# Patient Record
Sex: Female | Born: 1970
Health system: Southern US, Community
[De-identification: ages and names within clinical notes are randomized; demographics above are authoritative.]

## PROBLEM LIST (undated history)

## (undated) DIAGNOSIS — K219 Gastro-esophageal reflux disease without esophagitis: Secondary | ICD-10-CM

## (undated) DIAGNOSIS — L309 Dermatitis, unspecified: Secondary | ICD-10-CM

## (undated) DIAGNOSIS — M1990 Unspecified osteoarthritis, unspecified site: Secondary | ICD-10-CM

## (undated) DIAGNOSIS — T170XXA Foreign body in nasal sinus, initial encounter: Secondary | ICD-10-CM

## (undated) HISTORY — DX: Unspecified osteoarthritis, unspecified site: M19.90

## (undated) HISTORY — DX: Foreign body in nasal sinus, initial encounter: T17.0XXA

## (undated) HISTORY — DX: Gastro-esophageal reflux disease without esophagitis: K21.9

## (undated) HISTORY — DX: Dermatitis, unspecified: L30.9

---

## 1999-10-01 ENCOUNTER — Ambulatory Visit (HOSPITAL_COMMUNITY): Admission: RE | Admit: 1999-10-01 | Discharge: 1999-10-01 | Payer: Self-pay | Admitting: *Deleted

## 1999-10-16 ENCOUNTER — Inpatient Hospital Stay (HOSPITAL_COMMUNITY): Admission: AD | Admit: 1999-10-16 | Discharge: 1999-10-18 | Payer: Self-pay | Admitting: *Deleted

## 1999-10-16 ENCOUNTER — Encounter (INDEPENDENT_AMBULATORY_CARE_PROVIDER_SITE_OTHER): Payer: Self-pay | Admitting: *Deleted

## 1999-10-20 ENCOUNTER — Encounter: Admission: RE | Admit: 1999-10-20 | Discharge: 2000-01-18 | Payer: Self-pay | Admitting: *Deleted

## 2000-09-24 ENCOUNTER — Ambulatory Visit (HOSPITAL_COMMUNITY): Admission: RE | Admit: 2000-09-24 | Discharge: 2000-09-24 | Payer: Self-pay | Admitting: *Deleted

## 2001-02-22 ENCOUNTER — Inpatient Hospital Stay (HOSPITAL_COMMUNITY): Admission: AD | Admit: 2001-02-22 | Discharge: 2001-02-24 | Payer: Self-pay | Admitting: Obstetrics

## 2002-02-08 ENCOUNTER — Encounter: Admission: RE | Admit: 2002-02-08 | Discharge: 2002-02-08 | Payer: Self-pay | Admitting: *Deleted

## 2002-02-25 ENCOUNTER — Encounter: Admission: RE | Admit: 2002-02-25 | Discharge: 2002-02-25 | Payer: Self-pay | Admitting: *Deleted

## 2002-05-02 ENCOUNTER — Encounter: Admission: RE | Admit: 2002-05-02 | Discharge: 2002-05-02 | Payer: Self-pay | Admitting: Internal Medicine

## 2002-05-25 ENCOUNTER — Encounter: Admission: RE | Admit: 2002-05-25 | Discharge: 2002-05-25 | Payer: Self-pay | Admitting: Internal Medicine

## 2003-08-02 ENCOUNTER — Inpatient Hospital Stay (HOSPITAL_COMMUNITY): Admission: AD | Admit: 2003-08-02 | Discharge: 2003-08-02 | Payer: Self-pay | Admitting: Obstetrics and Gynecology

## 2003-10-25 ENCOUNTER — Ambulatory Visit (HOSPITAL_COMMUNITY): Admission: RE | Admit: 2003-10-25 | Discharge: 2003-10-25 | Payer: Self-pay | Admitting: *Deleted

## 2004-01-26 ENCOUNTER — Inpatient Hospital Stay (HOSPITAL_COMMUNITY): Admission: AD | Admit: 2004-01-26 | Discharge: 2004-01-26 | Payer: Self-pay | Admitting: *Deleted

## 2004-03-12 ENCOUNTER — Inpatient Hospital Stay (HOSPITAL_COMMUNITY): Admission: AD | Admit: 2004-03-12 | Discharge: 2004-03-14 | Payer: Self-pay | Admitting: *Deleted

## 2004-05-28 ENCOUNTER — Encounter: Admission: RE | Admit: 2004-05-28 | Discharge: 2004-05-28 | Payer: Self-pay | Admitting: Obstetrics and Gynecology

## 2004-10-20 HISTORY — PX: OTHER SURGICAL HISTORY: SHX169

## 2006-08-10 ENCOUNTER — Emergency Department (HOSPITAL_COMMUNITY): Admission: EM | Admit: 2006-08-10 | Discharge: 2006-08-10 | Payer: Self-pay | Admitting: Emergency Medicine

## 2006-08-26 ENCOUNTER — Ambulatory Visit (HOSPITAL_COMMUNITY): Admission: RE | Admit: 2006-08-26 | Discharge: 2006-08-26 | Payer: Self-pay | Admitting: Obstetrics and Gynecology

## 2008-09-27 ENCOUNTER — Ambulatory Visit: Payer: Self-pay | Admitting: Obstetrics and Gynecology

## 2008-09-28 ENCOUNTER — Ambulatory Visit (HOSPITAL_COMMUNITY): Admission: RE | Admit: 2008-09-28 | Discharge: 2008-09-28 | Payer: Self-pay | Admitting: Obstetrics & Gynecology

## 2008-10-11 ENCOUNTER — Ambulatory Visit: Payer: Self-pay | Admitting: Obstetrics and Gynecology

## 2011-03-04 NOTE — Group Therapy Note (Signed)
NAMELESHAE, MCCLAY NO.:  [id number]   MEDICAL RECORD NO.:  [id number]          PATIENT TYPE:  WOC   LOCATION:  [facility]                   FACILITY:  [facility]   PHYSICIAN:  Argentina Donovan, MD        DATE OF BIRTH:  1971-06-06   DATE OF SERVICE:  09/27/2008                                  CLINIC NOTE   The patient is a 40 year old [place] female, gravida 3, para 3-0-0-3,  who had an IUD Mirena placed in 2005, [facility].  She  then went back to [place] and was seen by a physician there who on  examination found she had a clear cervical prolapse almost protruding  through the vagina at which time she was wearing a vaginal ring to  support the prolapse and had an intrauterine coil, which must be the  word they use there to describe intrauterine contraceptive devices as  that did not make much sense to me.  She was seen there in 2006, after  having had all her deliveries normally.  She resisted the idea of a  vaginal hysterectomy and repair, so he carried out shortening of the  vaginal portion of the cervix.  Under general anesthesia, shortening of  the vaginal portion of the cervix was done with refashioning of the  epithelium in support with the cardinal ligaments and the intrauterine  device was left in situ.  In 2009, she wanted the IUD removed.  On  examination, the tail could not be seen.  She was admitted to hospital  for removal of the IUD under general anesthesia and it could not be  removed, although it was seen on ultrasound.  The procedure was  abandoned for fear of complications.  She was discharged to home and  comes in today requesting the IUD removed.  I am not sure from his  description whether he placed another IUD or whether coil is the word  that they use for [place]UD [place].  In any case, I am going to  get an ultrasound to confirm the presence of the IUD and then when she  comes in again have a pelvic examination to see  whether or not it is  possible that the IUD can be removed or does she have cervical stenosis  to the point that it cannot.  She has not had any bleeding since a year  after the original IUD was placed which was 2006.   IMPRESSION:  If there is a trapped IUD, it is possible to remove this  IUD without doing a hysterectomy or some other major operation.  So I  think we need to find out as much as we can about that and hopefully the  ultrasound will give [place] some more information and then a plan can be  made or a discussion more intelligently with what can be expected as the  patient says after 5 years they told her to get the IUD out.           ______________________________  Argentina Donovan, MD     PR/MEDQ  D:  09/27/2008  T:  09/28/2008  Job:  [id number]

## 2011-03-07 NOTE — Group Therapy Note (Signed)
NAMENAIDELYN, PARRELLA NO.:  [id number]   MEDICAL RECORD NO.:  [id number]                   PATIENT TYPE:  OUT   LOCATION:  [facility]                           FACILITY:  [facility]   PHYSICIAN:  Argentina Donovan, MD                     DATE OF BIRTH:  1971-04-16   DATE OF SERVICE:  05/28/2004                                    CLINIC NOTE   REASON FOR VISIT:  The patient is a 40 year old [place] female gravida 3  para 3-0-0-3 who has been going to the health department and recently had a  Mirena IUD placed.  Her husband brought her in today because, first of all,  he thought the IUD was coming out; and secondly, the woman has a complete  uterine prolapse, a third degree.  We have examined the patient.  She has a  small uterus without a significant cystocele and only a small rectocele.  The string of the IUD was out 4 cm from the cervix and was cut short, even  with the cervix.  We discussed the different methods of possible surgery for  this lady who is not anxious to lose her uterus because of cultural reasons.  She had used a ring pessary prior to the birth of her other child and I  suggested her trying perhaps a balloon pessary which could be removed in  order to allow for sexual intercourse.  The husband and wife are going to  decide and think about the methods we discussed.  I have also suggested that  she might see Dr. Conley Simmonds in consultation and get some advice from her.   IMPRESSION:  Third degree uterine prolapse with minimal asymptomatic  cystocele and moderate rectocele.                                               Argentina Donovan, MD    PR/MEDQ  D:  05/28/2004  T:  05/29/2004  Job:  [id number]

## 2013-08-31 ENCOUNTER — Ambulatory Visit: Payer: No Typology Code available for payment source | Attending: Internal Medicine | Admitting: Internal Medicine

## 2013-08-31 ENCOUNTER — Encounter: Payer: Self-pay | Admitting: Internal Medicine

## 2013-08-31 VITALS — BP 109/77 | HR 83 | Temp 98.7°F | Resp 16 | Ht 65.0 in | Wt 164.0 lb

## 2013-08-31 DIAGNOSIS — R141 Gas pain: Secondary | ICD-10-CM | POA: Insufficient documentation

## 2013-08-31 DIAGNOSIS — N926 Irregular menstruation, unspecified: Secondary | ICD-10-CM

## 2013-08-31 DIAGNOSIS — R142 Eructation: Secondary | ICD-10-CM | POA: Insufficient documentation

## 2013-08-31 LAB — CBC
HCT: 39.3 % (ref 36.0–46.0)
Platelets: 365 10*3/uL (ref 150–400)
RBC: 4.52 MIL/uL (ref 3.87–5.11)
RDW: 13.7 % (ref 11.5–15.5)
WBC: 12.7 10*3/uL — ABNORMAL HIGH (ref 4.0–10.5)

## 2013-08-31 LAB — POCT URINALYSIS DIPSTICK
Glucose, UA: NEGATIVE
Ketones, UA: NEGATIVE
Leukocytes, UA: NEGATIVE
Protein, UA: NEGATIVE

## 2013-08-31 LAB — COMPLETE METABOLIC PANEL WITH GFR
ALT: 9 U/L (ref 0–35)
AST: 14 U/L (ref 0–37)
Chloride: 104 mEq/L (ref 96–112)
Creat: 0.55 mg/dL (ref 0.50–1.10)
Sodium: 138 mEq/L (ref 135–145)
Total Bilirubin: 0.3 mg/dL (ref 0.3–1.2)

## 2013-08-31 NOTE — Progress Notes (Signed)
Patient Demographics  Leah Cain, is a 42 y.o. female  ZOX:[id number]  [id number]  DOB - July 23, 1971  Chief Complaint  Patient presents with  . Establish Care  . Gynecologic Exam    need referral  . Bloated        Subjective:   Leah Cain today is here to establish primary care.  Patient is a 42 year old [place] female with no significant medical problems-she does have a history of vaginal prolapse-had a partial hysterectomy, also had a IUD in place that was not able to be removed in 2009, and is still in place. She has a note from her doctor in her back saying that even under general anesthesia the IUD could not be removed-this was in 2009. She presents to the clinic today seeking a GYN referral, and complaining of bloating-denies abdominal pain. Since 2009she has not had a menstrual period.  Any fever, vaginal discharge, abdominal pain, nausea, vomiting, diarrhea and dysuria.No Cough or SOB.   Currently patient has no complaints.   Objective:    Filed Vitals:   08/31/13 1607  BP: 109/77  Pulse: 83  Temp: 98.7 F (37.1 C)  TempSrc: Oral  Resp: 16  Height: 5\' 5"  (1.651 m)  Weight: 164 lb (74.39 kg)  SpO2: 100%     ALLERGIES:  No Known Allergies  PAST MEDICAL HISTORY: Past Medical History  Diagnosis Date  . Eczema     PAST SURGICAL HISTORY: Past Surgical History  Procedure Laterality Date  . Cervical prolapse  2006    FAMILY HISTORY: Family History  Problem Relation Age of Onset  . Stroke Father     MEDICATIONS AT HOME: Prior to Admission medications   Not on File    SOCIAL HISTORY:   reports that she has never smoked. She does not have any smokeless tobacco history on file. She reports that she does not drink alcohol or use illicit drugs.  REVIEW OF SYSTEMS:  Constitutional:   No   Fevers, chills, fatigue.  HEENT:    No headaches, Sore throat,   Cardio-vascular: No chest pain,  Orthopnea, swelling in lower extremities, anasarca,  palpitations  GI:  No abdominal pain, nausea, vomiting, diarrhea  Resp: No shortness of breath,  No coughing up of blood.No cough.No wheezing.  Skin:  no rash or lesions.  GU:  no dysuria, change in color of urine, no urgency or frequency.  No flank pain.  Musculoskeletal: No joint pain or swelling.  No decreased range of motion.  No back pain.  Psych: No change in mood or affect. No depression or anxiety.  No memory loss.   Exam  General appearance :Awake, alert, not in any distress. Speech Clear. Not toxic Looking HEENT: Atraumatic and Normocephalic, pupils equally reactive to light and accomodation Neck: supple, no JVD. No cervical lymphadenopathy.  Chest:Good air entry bilaterally, no added sounds  CVS: S1 S2 regular, no murmurs.  Abdomen: Bowel sounds present, Non tender and not distended with no gaurding, rigidity or rebound. Extremities: B/L Lower Ext shows no edema, both legs are warm to touch Neurology: Awake alert, and oriented X 3, CN II-XII intact, Non focal Skin:No Rash Wounds:N/A    Data Review   CBC No results found for this basename: WBC, HGB, HCT, PLT, MCV, MCH, MCHC, RDW, NEUTRABS, LYMPHSABS, MONOABS, EOSABS, BASOSABS, BANDABS, BANDSABD,  in the last 168 hours  Chemistries   No results found for this basename: NA, K, CL, CO2, GLUCOSE, BUN, CREATININE, GFRCGP, CALCIUM, MG, AST, ALT, ALKPHOS, BILITOT,  in the last 168 hours ------------------------------------------------------------------------------------------------------------------ No results found for this basename: HGBA1C,  in the last 72 hours ------------------------------------------------------------------------------------------------------------------ No results found for this basename: CHOL, HDL, LDLCALC, TRIG, CHOLHDL, LDLDIRECT,  in the last 72 hours ------------------------------------------------------------------------------------------------------------------ No results found for  this basename: TSH, T4TOTAL, FREET3, T3FREE, THYROIDAB,  in the last 72 hours ------------------------------------------------------------------------------------------------------------------ No results found for this basename: VITAMINB12, FOLATE, FERRITIN, TIBC, IRON, RETICCTPCT,  in the last 72 hours  Coagulation profile  No results found for this basename: INR, PROTIME,  in the last 168 hours    Assessment & Plan   General prolapse/retained IUD - Will seek urgent referral to GYN  In the meantime we'll check CBC, chemistries, TSH  Will also order a pelvic and transvaginal ultrasound   Follow up in 2-3 weeks  The patient was given clear instructions to go to ER or return to medical center if symptoms don't improve, worsen or new problems develop. The patient verbalized understanding. The patient was told to call to get lab results if they haven't heard anything in the next week.

## 2013-08-31 NOTE — Addendum Note (Signed)
Addended by: Nonnie Done D on: 08/31/2013 04:55 PM   Modules accepted: Orders

## 2013-08-31 NOTE — Progress Notes (Signed)
Pt here with family to interpreter Arabic language Establish care for hx bloating post cervical prolapse with surgical procedure States since surgery IUD lodged due to possible complication if removed. Pt has HPI  Denies vaginal bleeding or n/v/fever LMP- 2006 Need dental/gyn referral for IUD removal

## 2013-09-01 LAB — HEMOGLOBIN A1C
Hgb A1c MFr Bld: 5.1 % (ref <5.7)
Mean Plasma Glucose: 100 mg/dL (ref <117)

## 2013-09-02 ENCOUNTER — Ambulatory Visit (HOSPITAL_COMMUNITY)
Admission: RE | Admit: 2013-09-02 | Discharge: 2013-09-02 | Disposition: A | Discharge status: Home / Self Care | Payer: No Typology Code available for payment source | Source: Ambulatory Visit | Attending: Internal Medicine | Admitting: Internal Medicine

## 2013-09-02 DIAGNOSIS — N83209 Unspecified ovarian cyst, unspecified side: Secondary | ICD-10-CM | POA: Insufficient documentation

## 2013-09-02 DIAGNOSIS — Z975 Presence of (intrauterine) contraceptive device: Secondary | ICD-10-CM | POA: Insufficient documentation

## 2013-09-02 DIAGNOSIS — I862 Pelvic varices: Secondary | ICD-10-CM | POA: Insufficient documentation

## 2013-09-02 DIAGNOSIS — N926 Irregular menstruation, unspecified: Secondary | ICD-10-CM

## 2013-09-06 ENCOUNTER — Other Ambulatory Visit: Payer: Self-pay | Admitting: Internal Medicine

## 2013-09-06 DIAGNOSIS — Z1231 Encounter for screening mammogram for malignant neoplasm of breast: Secondary | ICD-10-CM

## 2013-09-07 ENCOUNTER — Telehealth: Payer: Self-pay | Admitting: Emergency Medicine

## 2013-09-07 NOTE — Telephone Encounter (Signed)
Message copied by Darlis Loan on Wed Sep 07, 2013  4:01 PM ------      Message from: Maretta Bees      Created: Tue Sep 06, 2013  2:14 PM       Patient needs referral to GYN Clinic for IUD retrieval-please make sure patient has a appointment, referral was placed last week. ------

## 2013-09-07 NOTE — Telephone Encounter (Signed)
Pt given referral information. Husband translated instructions

## 2013-09-19 ENCOUNTER — Encounter: Payer: Self-pay | Admitting: Internal Medicine

## 2013-09-19 ENCOUNTER — Ambulatory Visit: Payer: No Typology Code available for payment source | Attending: Internal Medicine | Admitting: Internal Medicine

## 2013-09-19 VITALS — BP 102/69 | HR 74 | Temp 98.3°F | Resp 16 | Ht 62.0 in | Wt 167.0 lb

## 2013-09-19 DIAGNOSIS — R1013 Epigastric pain: Secondary | ICD-10-CM | POA: Insufficient documentation

## 2013-09-19 DIAGNOSIS — K029 Dental caries, unspecified: Secondary | ICD-10-CM

## 2013-09-19 DIAGNOSIS — T8332XA Displacement of intrauterine contraceptive device, initial encounter: Secondary | ICD-10-CM | POA: Insufficient documentation

## 2013-09-19 DIAGNOSIS — T8339XA Other mechanical complication of intrauterine contraceptive device, initial encounter: Secondary | ICD-10-CM

## 2013-09-19 DIAGNOSIS — R1011 Right upper quadrant pain: Secondary | ICD-10-CM

## 2013-09-19 MED ORDER — TRAMADOL HCL 50 MG PO TABS: 50.0000 mg | ORAL_TABLET | Freq: Three times a day (TID) | ORAL | Status: DC | PRN

## 2013-09-19 MED ORDER — PANTOPRAZOLE SODIUM 40 MG PO TBEC: 40.0000 mg | DELAYED_RELEASE_TABLET | Freq: Every day | ORAL | Status: DC

## 2013-09-19 NOTE — Progress Notes (Signed)
Pt is needing a referral to a surgeon to remove her IUD.

## 2013-09-19 NOTE — Progress Notes (Signed)
Patient ID: Leah Cain, female   DOB: 03-05-1971, 42 y.o.   MRN: [id number] Patient Demographics  Leah Cain, is a 42 y.o. female  [id number]  [id number]  DOB - 11/10/1970  Chief Complaint  Patient presents with  . Follow-up        Subjective:   Leah Cain is a 42 y.o. female here today for a follow up visit. Patient claims that she had IUD insertion 9 years ago, she subsequently had a kind of surgery in [place] which was complicated by the IUD missing on chronic abdominal pain. Patient husband told me that the surgeon told him if he had to remove the IUD, that he would damage the uterus and that they better leave the IUD in situ. Patient also have pain in the upper part of the abdomen mostly on the right side radiating to the back usually after eating. Husband claimed that he will give her a tablet of oxycodone from his own prescription to relieve the pain. Pain is only after eating, no history of gallstone or PUD. No blood in stool. No nausea or vomiting, no diarrhea, no abdominal distention but she feels bloated after eating. Patient has No headache, No chest pain, No new weakness tingling or numbness, No Cough - SOB.  ALLERGIES: No Known Allergies  PAST MEDICAL HISTORY: Past Medical History  Diagnosis Date  . Eczema     MEDICATIONS AT HOME: Prior to Admission medications   Medication Sig Start Date End Date Taking? Authorizing Provider  pantoprazole (PROTONIX) 40 MG tablet Take 1 tablet (40 mg total) by mouth daily. 09/19/13   Jeanann Lewandowsky, MD  traMADol (ULTRAM) 50 MG tablet Take 1 tablet (50 mg total) by mouth every 8 (eight) hours as needed. 09/19/13   Jeanann Lewandowsky, MD     Objective:   Filed Vitals:   09/19/13 1633  BP: 102/69  Pulse: 74  Temp: 98.3 F (36.8 C)  TempSrc: Oral  Resp: 16  Height: 5\' 2"  (1.575 m)  Weight: 167 lb (75.751 kg)  SpO2: 98%    Exam General appearance : Awake, alert, not in any distress. Speech Clear. Not toxic  looking HEENT: Atraumatic and Normocephalic, pupils equally reactive to light and accomodation Neck: supple, no JVD. No cervical lymphadenopathy.  Chest:Good air entry bilaterally, no added sounds  CVS: S1 S2 regular, no murmurs.  Abdomen: Bowel sounds present, Non tender and not distended with no gaurding, rigidity or rebound. Extremities: B/L Lower Ext shows no edema, both legs are warm to touch Neurology: Awake alert, and oriented X 3, CN II-XII intact, Non focal Skin:No Rash Wounds:N/A   Data Review   CBC No results found for this basename: WBC, HGB, HCT, PLT, MCV, MCH, MCHC, RDW, NEUTRABS, LYMPHSABS, MONOABS, EOSABS, BASOSABS, BANDABS, BANDSABD,  in the last 168 hours  Chemistries   No results found for this basename: NA, K, CL, CO2, GLUCOSE, BUN, CREATININE, GFRCGP, CALCIUM, MG, AST, ALT, ALKPHOS, BILITOT,  in the last 168 hours ------------------------------------------------------------------------------------------------------------------ No results found for this basename: HGBA1C,  in the last 72 hours ------------------------------------------------------------------------------------------------------------------ No results found for this basename: CHOL, HDL, LDLCALC, TRIG, CHOLHDL, LDLDIRECT,  in the last 72 hours ------------------------------------------------------------------------------------------------------------------ No results found for this basename: TSH, T4TOTAL, FREET3, T3FREE, THYROIDAB,  in the last 72 hours ------------------------------------------------------------------------------------------------------------------ No results found for this basename: VITAMINB12, FOLATE, FERRITIN, TIBC, IRON, RETICCTPCT,  in the last 72 hours  Coagulation profile  No results found for this basename: INR, PROTIME,  in the last 168 hours  Assessment & Plan   1. IUD migration, initial encounter  - Ambulatory referral to Gynecology IUD removal  2. Postprandial  epigastric pain Will try PPI for possible GERD/PUD Ultrasound of the abdomen to rule out cholelithiasis  3. Postprandial RUQ pain  - traMADol (ULTRAM) 50 MG tablet; Take 1 tablet (50 mg total) by mouth every 8 (eight) hours as needed.  Dispense: 60 tablet; Refill: 0 - pantoprazole (PROTONIX) 40 MG tablet; Take 1 tablet (40 mg total) by mouth daily.  Dispense: 30 tablet; Refill: 3 - US Abdomen Complete  4. Dental caries  - Ambulatory referral to Dentistry    Follow up in    The patient was given clear instructions to go to ER or return to medical center if symptoms don't improve, worsen or new problems develop. The patient verbalized understanding. The patient was told to call to get lab results if they haven't heard anything in the next week.    Jeanann Lewandowsky, MD, MHA, FACP, FAAP [facility] [place], [place] [phone]   09/19/2013, 5:08 PM

## 2013-09-19 NOTE — Patient Instructions (Signed)
Abdominal Pain, Women °Abdominal (stomach, pelvic, or belly) pain can be caused by many things. It is important to tell your doctor: °· The location of the pain. °· Does it come and go or is it present all the time? °· Are there things that start the pain (eating certain foods, exercise)? °· Are there other symptoms associated with the pain (fever, nausea, vomiting, diarrhea)? °All of this is helpful to know when trying to find the cause of the pain. °CAUSES  °· Stomach: virus or bacteria infection, or ulcer. °· Intestine: appendicitis (inflamed appendix), regional ileitis (Crohn's disease), ulcerative colitis (inflamed colon), irritable bowel syndrome, diverticulitis (inflamed diverticulum of the colon), or cancer of the stomach or intestine. °· Gallbladder disease or stones in the gallbladder. °· Kidney disease, kidney stones, or infection. °· Pancreas infection or cancer. °· Fibromyalgia (pain disorder). °· Diseases of the female organs: °· Uterus: fibroid (non-cancerous) tumors or infection. °· Fallopian tubes: infection or tubal pregnancy. °· Ovary: cysts or tumors. °· Pelvic adhesions (scar tissue). °· Endometriosis (uterus lining tissue growing in the pelvis and on the pelvic organs). °· Pelvic congestion syndrome (female organs filling up with blood just before the menstrual period). °· Pain with the menstrual period. °· Pain with ovulation (producing an egg). °· Pain with an IUD (intrauterine device, birth control) in the uterus. °· Cancer of the female organs. °· Functional pain (pain not caused by a disease, may improve without treatment). °· Psychological pain. °· Depression. °DIAGNOSIS  °Your doctor will decide the seriousness of your pain by doing an examination. °· Blood tests. °· X-rays. °· Ultrasound. °· CT scan (computed tomography, special type of X-ray). °· MRI (magnetic resonance imaging). °· Cultures, for infection. °· Barium enema (dye inserted in the large intestine, to better view it with  X-rays). °· Colonoscopy (looking in intestine with a lighted tube). °· Laparoscopy (minor surgery, looking in abdomen with a lighted tube). °· Major abdominal exploratory surgery (looking in abdomen with a large incision). °TREATMENT  °The treatment will depend on the cause of the pain.  °· Many cases can be observed and treated at home. °· Over-the-counter medicines recommended by your caregiver. °· Prescription medicine. °· Antibiotics, for infection. °· Birth control pills, for painful periods or for ovulation pain. °· Hormone treatment, for endometriosis. °· Nerve blocking injections. °· Physical therapy. °· Antidepressants. °· Counseling with a psychologist or psychiatrist. °· Minor or major surgery. °HOME CARE INSTRUCTIONS  °· Do not take laxatives, unless directed by your caregiver. °· Take over-the-counter pain medicine only if ordered by your caregiver. Do not take aspirin because it can cause an upset stomach or bleeding. °· Try a clear liquid diet (broth or water) as ordered by your caregiver. Slowly move to a bland diet, as tolerated, if the pain is related to the stomach or intestine. °· Have a thermometer and take your temperature several times a day, and record it. °· Bed rest and sleep, if it helps the pain. °· Avoid sexual intercourse, if it causes pain. °· Avoid stressful situations. °· Keep your follow-up appointments and tests, as your caregiver orders. °· If the pain does not go away with medicine or surgery, you may try: °· Acupuncture. °· Relaxation exercises (yoga, meditation). °· Group therapy. °· Counseling. °SEEK MEDICAL CARE IF:  °· You notice certain foods cause stomach pain. °· Your home care treatment is not helping your pain. °· You need stronger pain medicine. °· You want your IUD removed. °· You feel faint or   lightheaded. °· You develop nausea and vomiting. °· You develop a rash. °· You are having side effects or an allergy to your medicine. °SEEK IMMEDIATE MEDICAL CARE IF:  °· Your  pain does not go away or gets worse. °· You have a fever. °· Your pain is felt only in portions of the abdomen. The right side could possibly be appendicitis. The left lower portion of the abdomen could be colitis or diverticulitis. °· You are passing blood in your stools (bright red or black tarry stools, with or without vomiting). °· You have blood in your urine. °· You develop chills, with or without a fever. °· You pass out. °MAKE SURE YOU:  °· Understand these instructions. °· Will watch your condition. °· Will get help right away if you are not doing well or get worse. °Document Released: 08/03/2007 Document Revised: 12/29/2011 Document Reviewed: 08/23/2009 °ExitCare® Patient Information ©2014 [employer]. ° °

## 2013-09-23 ENCOUNTER — Ambulatory Visit (HOSPITAL_COMMUNITY)
Admission: RE | Admit: 2013-09-23 | Discharge: 2013-09-23 | Disposition: A | Discharge status: Home / Self Care | Payer: No Typology Code available for payment source | Source: Ambulatory Visit | Attending: Internal Medicine | Admitting: Internal Medicine

## 2013-09-23 ENCOUNTER — Ambulatory Visit (HOSPITAL_COMMUNITY): Payer: No Typology Code available for payment source

## 2013-09-23 DIAGNOSIS — R1011 Right upper quadrant pain: Secondary | ICD-10-CM

## 2013-09-23 DIAGNOSIS — R109 Unspecified abdominal pain: Secondary | ICD-10-CM | POA: Insufficient documentation

## 2013-10-04 ENCOUNTER — Ambulatory Visit: Payer: No Typology Code available for payment source

## 2013-10-05 ENCOUNTER — Ambulatory Visit
Admission: RE | Admit: 2013-10-05 | Discharge: 2013-10-05 | Disposition: A | Discharge status: Home / Self Care | Payer: No Typology Code available for payment source | Source: Ambulatory Visit | Attending: Internal Medicine | Admitting: Internal Medicine

## 2013-10-05 DIAGNOSIS — Z1231 Encounter for screening mammogram for malignant neoplasm of breast: Secondary | ICD-10-CM

## 2013-10-10 ENCOUNTER — Telehealth: Payer: Self-pay | Admitting: *Deleted

## 2013-10-10 NOTE — Telephone Encounter (Signed)
I spoke to the husband who speaks better english and told him that her ultrasound came back normal.

## 2013-10-10 NOTE — Telephone Encounter (Signed)
Message copied by Raynelle Chary on Mon Oct 10, 2013 12:31 PM ------      Message from: Quentin Angst      Created: Fri Oct 07, 2013  3:46 PM       Please inform patient that her abdominal ultrasound is normal ------

## 2013-10-26 ENCOUNTER — Ambulatory Visit: Payer: No Typology Code available for payment source | Attending: Internal Medicine | Admitting: Internal Medicine

## 2013-10-26 ENCOUNTER — Ambulatory Visit: Payer: Medicaid Other

## 2013-10-26 ENCOUNTER — Encounter: Payer: Self-pay | Admitting: Internal Medicine

## 2013-10-26 VITALS — BP 123/83 | HR 67 | Temp 97.7°F | Resp 16 | Wt 169.0 lb

## 2013-10-26 DIAGNOSIS — M545 Low back pain, unspecified: Secondary | ICD-10-CM

## 2013-10-26 DIAGNOSIS — G8929 Other chronic pain: Secondary | ICD-10-CM

## 2013-10-26 DIAGNOSIS — R0981 Nasal congestion: Secondary | ICD-10-CM

## 2013-10-26 DIAGNOSIS — K029 Dental caries, unspecified: Secondary | ICD-10-CM

## 2013-10-26 DIAGNOSIS — R1011 Right upper quadrant pain: Secondary | ICD-10-CM

## 2013-10-26 DIAGNOSIS — J3489 Other specified disorders of nose and nasal sinuses: Secondary | ICD-10-CM

## 2013-10-26 DIAGNOSIS — M79675 Pain in left toe(s): Secondary | ICD-10-CM

## 2013-10-26 DIAGNOSIS — M79609 Pain in unspecified limb: Secondary | ICD-10-CM

## 2013-10-26 MED ORDER — FLUTICASONE PROPIONATE 50 MCG/ACT NA SUSP: 2.0000 | Freq: Every day | NASAL | Status: DC

## 2013-10-26 MED ORDER — TRAMADOL HCL 50 MG PO TABS: 50.0000 mg | ORAL_TABLET | Freq: Three times a day (TID) | ORAL | Status: DC | PRN

## 2013-10-26 NOTE — Progress Notes (Signed)
Patient here for  Medication refill Complains of chronic back pain Congestion and sneezing at night and in the am

## 2013-10-26 NOTE — Progress Notes (Signed)
MRN: [id number] Name: Leah Cain  Sex: female Age: 43 y.o. DOB: 1970-12-31  Allergies: Review of patient's allergies indicates no known allergies.  Chief Complaint  Patient presents with  . Back Pain    HPI: Patient is 43 y.o. female who comes today requesting refill on her pain medication has history of lower back pain, she also reported to have lot of nasal congestion dry mouth denies any sore throat fever chills chest pain or shortness of breath, she has lot of dental caries and is requesting to see a dentist, patient also reported to have left foot pain in the toes for long time which is getting worse.  Past Medical History  Diagnosis Date  . Eczema     Past Surgical History  Procedure Laterality Date  . Cervical prolapse  2006      Medication List       This list is accurate as of: 10/26/13  3:20 PM.  Always use your most recent med list.               fluticasone 50 MCG/ACT nasal spray  Commonly known as:  FLONASE  Place 2 sprays into both nostrils daily.     pantoprazole 40 MG tablet  Commonly known as:  PROTONIX  Take 1 tablet (40 mg total) by mouth daily.     traMADol 50 MG tablet  Commonly known as:  ULTRAM  Take 1 tablet (50 mg total) by mouth every 8 (eight) hours as needed.        Meds ordered this encounter  Medications  . fluticasone (FLONASE) 50 MCG/ACT nasal spray    Sig: Place 2 sprays into both nostrils daily.    Dispense:  16 g    Refill:  0  . traMADol (ULTRAM) 50 MG tablet    Sig: Take 1 tablet (50 mg total) by mouth every 8 (eight) hours as needed.    Dispense:  60 tablet    Refill:  0     There is no immunization history on file for this patient.  Family History  Problem Relation Age of Onset  . Stroke Father     History  Substance Use Topics  . Smoking status: Never Smoker   . Smokeless tobacco: Not on file  . Alcohol Use: No    Review of Systems  As noted in HPI  Filed Vitals:   10/26/13 1500  BP: 123/83   Pulse: 67  Temp: 97.7 F (36.5 C)  Resp: 16    Physical Exam  Physical Exam  HENT:  Nasal congestion no sinus tenderness   Eyes: EOM are normal. Pupils are equal, round, and reactive to light.  Cardiovascular: Normal rate and regular rhythm.   Pulmonary/Chest: Breath sounds normal. No respiratory distress. She has no wheezes. She has no rales.  Musculoskeletal:  Lower lumbar spinal and paraspinal tenderness, with SLR test patient complains of back discomfort , DTR 2+   Left foot toes with lateral deviation some tenderness to touch     CBC    Component Value Date/Time   WBC 12.7* 08/31/2013 1646   RBC 4.52 08/31/2013 1646   HGB 13.0 08/31/2013 1646   HCT 39.3 08/31/2013 1646   PLT 365 08/31/2013 1646   MCV 86.9 08/31/2013 1646    CMP     Component Value Date/Time   NA 138 08/31/2013 1655   K 3.6 08/31/2013 1655   CL 104 08/31/2013 1655   CO2 26 08/31/2013 1655   GLUCOSE 100*  08/31/2013 1655   BUN 13 08/31/2013 1655   CREATININE 0.55 08/31/2013 1655   CALCIUM 9.1 08/31/2013 1655   PROT 6.9 08/31/2013 1655   ALBUMIN 4.3 08/31/2013 1655   AST 14 08/31/2013 1655   ALT 9 08/31/2013 1655   ALKPHOS 73 08/31/2013 1655   BILITOT 0.3 08/31/2013 1655    No results found for this basename: chol, tri, ldl    No components found with this basename: hga1c    Lab Results  Component Value Date/Time   AST 14 08/31/2013  4:55 PM    Assessment and Plan  Chronic low back pain Patient was given refill on tramadol.  Nasal congestion - Plan: fluticasone (FLONASE) 50 MCG/ACT nasal spray  Pain in toe of left foot - Plan: Ambulatory referral to Podiatry  Dental cavities - Plan: Ambulatory referral to Dentistry  Followup her has a scheduled.  Lorayne Marek, MD

## 2013-11-02 ENCOUNTER — Encounter: Payer: Self-pay | Admitting: Podiatrist

## 2013-11-02 ENCOUNTER — Ambulatory Visit (INDEPENDENT_AMBULATORY_CARE_PROVIDER_SITE_OTHER): Payer: Medicaid Other

## 2013-11-02 ENCOUNTER — Ambulatory Visit (INDEPENDENT_AMBULATORY_CARE_PROVIDER_SITE_OTHER): Payer: Medicaid Other | Admitting: Podiatrist

## 2013-11-02 VITALS — BP 114/64 | HR 65 | Resp 18

## 2013-11-02 DIAGNOSIS — M2010 Hallux valgus (acquired), unspecified foot: Secondary | ICD-10-CM

## 2013-11-02 DIAGNOSIS — M79609 Pain in unspecified limb: Secondary | ICD-10-CM

## 2013-11-02 DIAGNOSIS — M216X9 Other acquired deformities of unspecified foot: Secondary | ICD-10-CM

## 2013-11-02 DIAGNOSIS — M2040 Other hammer toe(s) (acquired), unspecified foot: Secondary | ICD-10-CM

## 2013-11-02 DIAGNOSIS — R1011 Right upper quadrant pain: Secondary | ICD-10-CM

## 2013-11-02 MED ORDER — TRAMADOL HCL 50 MG PO TABS: 50.0000 mg | ORAL_TABLET | Freq: Three times a day (TID) | ORAL | Status: DC | PRN

## 2013-11-02 NOTE — Progress Notes (Signed)
   Subjective:    Patient ID: Leah Cain, female    DOB: [birth year], 43 y.o.   MRN: [id number]  HPI the pain is in my 2nd toe on the left and the pain is a 10 and no burning or throbbing and some numbness and tingling and hurts to wear shoes and the toes seems to be moving over each other and been going on for 7 years    Review of Systems  Constitutional: Negative.   HENT: Negative.   Eyes: Negative.   Respiratory: Negative.   Cardiovascular: Negative.   Gastrointestinal: Negative.   Endocrine: Negative.   Genitourinary: Negative.   Musculoskeletal: Negative.   Skin: Negative.   Allergic/Immunologic: Negative.   Neurological: Negative.   Hematological: Negative.   Psychiatric/Behavioral: Negative.        Objective:   Physical Exam Neurovascular status is intact with palpable pulses and neurological sensation intact bilaterally.  She has a significant bunion deformity present with a long 2nd toe.  The great toe is laterally deviated and is pressing against the 2nd toe.  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  Recommended a steroid injection which was declined.  Also discussed the risks and benefits or surgical correction.  Patient and her husband were given information regarding surgery and were instructed to consider their options.  If she elects to have the surgery done, she is to call for a pre-operative consult.

## 2013-11-02 NOTE — Patient Instructions (Addendum)
Bunionectomy A bunionectomy is surgery to remove a bunion. A bunion is an enlargement of the joint at the base of the big toe. It is made up of bone and soft tissue on the inside part of the joint. Over time, a painful lump appears on the inside of the joint. The big toe begins to point inward toward the second toe. New bone growth can occur and a bone spur may form. The pain eventually causes difficulty walking. A bunion usually results from inflammation caused by the irritation of poorly fitting shoes. It often begins later in life. A bunionectomy is performed when nonsurgical treatment no longer works. When surgery is needed, the extent of the procedure will depend on the degree of deformity of the foot. Your surgeon will discuss with you the different procedures and what will work best for you depending on your age and health. LET YOUR CAREGIVER KNOW ABOUT:   Previous problems with anesthetics or medicines used to numb the skin.  Allergies to dyes, iodine, foods, and/or latex.  Medicines taken including herbs, eye drops, prescription medicines (especially medicines used to "thin the blood"), aspirin and other over-the-counter medicines, and steroids (by mouth or as a cream).  History of bleeding or blood problems.  Possibility of pregnancy, if this applies.  History of blood clots in your legs and/or lungs .  Previous surgery.  Other important health problems. RISKS AND COMPLICATIONS   Infection.  Pain.  Nerve damage.  Possibility that the bunion will recur. BEFORE THE PROCEDURE  You should be present 60 minutes prior to your procedure or as directed.  PROCEDURE  Surgery is often done so that you can go home the same day (outpatient). It may be done in a hospital or in an outpatient surgical center. An anesthetic will be used to help you sleep during the procedure. Sometimes, a spinal anesthetic is used to make you numb below the waist. A cut (incision) is made over the swollen  area at the first joint of the big toe. The enlarged lump will be removed. If there is a need to reposition the bones of the big toe, this may require more than 1 incision. The bone itself may need to be cut. Screws and wires may be used in the repair. These can be removed at a later date. In severe cases, the entire joint may need to be removed and a joint replacement inserted. When done, the incision is closed with stitches (sutures). Skin adhesive strips may be added for reinforcement. They help hold the incision closed.  AFTER THE PROCEDURE  Compression bandages (dressings) are then wrapped around the wound. This helps to keep the foot in alignment and reduce swelling. Your foot will be monitored for bleeding and swelling. You will need to stay for a few hours in the recovery area before being discharged. This allows time for the anesthesia to wear off. You will be discharged home when you are awake, stable, and doing well. HOME CARE INSTRUCTIONS   You can expect to return to normal activities within 6 to 8 weeks after surgery. The foot is at increased risk for swelling for several months. When you can expect to bear weight on the operated foot will depend on the extent of your surgery. The milder the deformity, the less tissue is removed and the sooner the return to normal activity level. During the recovery period, a special shoe, boot, or cast may be worn to accommodate the surgical bandage and to help provide stability   to the foot.  Once you are home, an ice pack applied to the operative site may help with discomfort and keep swelling down. Stop using the ice if it causes discomfort.  Keep your feet raised (elevated) when possible to lessen swelling.  If you have an elastic bandage on your foot and you have numbness, tingling, or your foot becomes cold and blue, adjust the bandage to make it comfortable.  Change dressings as directed.  Keep the wound dry and clean. The wound may be washed  gently with soap and water. Gently blot dry without rubbing. Do not take baths or use swimming pools or hot tubs for 10 days, or as instructed by your caregiver.  Only take over-the-counter or prescription medicines for pain, discomfort, or fever as directed by your caregiver.  You may continue a normal diet as directed.  For activity, use crutches with no weight bearing or your orthopedic shoe as directed. Continue to use crutches or a cane as directed until you can stand without causing pain. SEEK MEDICAL CARE IF:   You have redness, swelling, bruising, or increasing pain in the wound.  There is pus coming from the wound.  You have drainage from a wound lasting longer than 1 day.  You have an oral temperature above 102 F (38.9 C).  You notice a bad smell coming from the wound or dressing.  The wound breaks open after sutures have been removed.  You develop dizzy episodes or fainting while standing.  You have persistent nausea or vomiting.  Your toes become cold.  Pain is not relieved with medicines. SEEK IMMEDIATE MEDICAL CARE IF:   You develop a rash.  You have difficulty breathing.  You develop any reaction or side effects to medicines given.  Your toes are numb or blue, or you have severe pain. MAKE SURE YOU:   Understand these instructions.  Will watch your condition.  Will get help right away if you are not doing well or get worse. Document Released: 09/19/2005 Document Revised: 12/29/2011 Document Reviewed: 10/25/2007 ExitCare Patient Information 2014 [employer].  

## 2013-11-07 ENCOUNTER — Ambulatory Visit (INDEPENDENT_AMBULATORY_CARE_PROVIDER_SITE_OTHER): Payer: Medicaid Other | Admitting: Family Medicine

## 2013-11-07 ENCOUNTER — Encounter: Payer: Self-pay | Admitting: Family Medicine

## 2013-11-07 VITALS — BP 115/71 | HR 76 | Temp 97.2°F | Ht 62.0 in | Wt 160.4 lb

## 2013-11-07 DIAGNOSIS — R1011 Right upper quadrant pain: Secondary | ICD-10-CM

## 2013-11-07 MED ORDER — PANTOPRAZOLE SODIUM 40 MG PO TBEC: 40.0000 mg | DELAYED_RELEASE_TABLET | Freq: Two times a day (BID) | ORAL | Status: DC

## 2013-11-07 MED ORDER — TRAMADOL HCL 50 MG PO TABS: 50.0000 mg | ORAL_TABLET | Freq: Three times a day (TID) | ORAL | Status: DC | PRN

## 2013-11-07 NOTE — Patient Instructions (Signed)
Bloating  Bloating is the feeling of fullness in your belly. You may feel as though your pants are too tight. Often the cause of bloating is overeating, retaining fluids, or having gas in your bowel. It is also caused by swallowing air and eating foods that cause gas. Irritable bowel syndrome is one of the most common causes of bloating. Constipation is also a common cause. Sometimes more serious problems can cause bloating.  SYMPTOMS   Usually there is a feeling of fullness, as though your abdomen is bulged out. There may be mild discomfort.   DIAGNOSIS   Usually no particular testing is necessary for most bloating. If the condition persists and seems to become worse, your caregiver may do additional testing.   TREATMENT   · There is no direct treatment for bloating.  · Do not put gas into the bowel. Avoid chewing gum and sucking on candy. These tend to make you swallow air. Swallowing air can also be a nervous habit. Try to avoid this.  · Avoiding high residue diets will help. Eat foods with soluble fibers (examples include root vegetables, apples, or barley) and substitute dairy products with soy and rice products. This helps irritable bowel syndrome.  · If constipation is the cause, then a high residue diet with more fiber will help.  · Avoid carbonated beverages.  · Over-the-counter preparations are available that help reduce gas. Your pharmacist can help you with this.  SEEK MEDICAL CARE IF:   · Bloating continues and seems to be getting worse.  · You notice a weight gain.  · You have a weight loss but the bloating is getting worse.  · You have changes in your bowel habits or develop nausea or vomiting.  SEEK IMMEDIATE MEDICAL CARE IF:   · You develop shortness of breath or swelling in your legs.  · You have an increase in abdominal pain or develop chest pain.  Document Released: 08/06/2006 Document Revised: 12/29/2011 Document Reviewed: 09/24/2007  ExitCare® Patient Information ©2014 [employer].

## 2013-11-07 NOTE — Progress Notes (Signed)
Pt having issues with abdominal pain related to eating. Pt reports post prandial she will have bloating and abdominal pain almost every meal. Pt has IUD for ~10 years and is concerned it may be related and would like it removed. Reports attempted removal and unable to do in [place]. Pt reports having surgery and after surgery unable to remove the IUD. IUD placed ~2005, surgery 2006  O: Filed Vitals:   11/07/13 1408  BP: 115/71  Pulse: 76  Temp: 97.2 F (36.2 C)   VSS, NAD SSE: pt with what appears to be a cuff, no cervix or string visualized.  Abdomin no severe tenderness but diffusely uncomfortable. No cce  [place]: IUD intrauterine  A/p: Pain and bloating unlikely from mirena. While pt does have menstraul cramping monthly they are mild and unlikely the etiology of discomfort. Will start patient on protonix 40mg  BID and collect a stool sample for evaluation of H pylori. If present will treat H. Pylori.   F/u 4wks for further evaluation or follow up with primary doctor that is also initiating a work up.  Discussed with Dr. Ihor Dow and recommend against surgical IUD removal if not causing pain. No indication for hysterectomy as long as patient is comfortable. No acute issues at this time.  Fredrik Rigger, MD OB Fellow

## 2013-11-11 NOTE — Addendum Note (Signed)
Addended by: Ernie Avena on: 11/11/2013 09:18 AM   Modules accepted: Orders

## 2013-11-15 ENCOUNTER — Ambulatory Visit: Payer: Medicaid Other | Admitting: Internal Medicine

## 2013-11-15 LAB — HELICOBACTER PYLORI  SPECIAL ANTIGEN: H. PYLORI ANTIGEN STOOL: POSITIVE

## 2013-11-23 ENCOUNTER — Ambulatory Visit: Payer: Medicaid Other | Admitting: Internal Medicine

## 2013-12-05 ENCOUNTER — Ambulatory Visit: Payer: Medicaid Other | Attending: Internal Medicine

## 2013-12-12 ENCOUNTER — Ambulatory Visit: Payer: Medicaid Other | Admitting: Obstetrics & Gynecology

## 2013-12-26 ENCOUNTER — Encounter: Payer: Self-pay | Admitting: Internal Medicine

## 2013-12-26 ENCOUNTER — Ambulatory Visit: Payer: Medicaid Other | Attending: Internal Medicine | Admitting: Internal Medicine

## 2013-12-26 VITALS — BP 116/81 | HR 72 | Temp 98.0°F | Resp 16 | Wt 169.8 lb

## 2013-12-26 DIAGNOSIS — Z09 Encounter for follow-up examination after completed treatment for conditions other than malignant neoplasm: Secondary | ICD-10-CM

## 2013-12-26 DIAGNOSIS — R1013 Epigastric pain: Secondary | ICD-10-CM

## 2013-12-26 DIAGNOSIS — A048 Other specified bacterial intestinal infections: Secondary | ICD-10-CM | POA: Insufficient documentation

## 2013-12-26 DIAGNOSIS — R1011 Right upper quadrant pain: Secondary | ICD-10-CM | POA: Insufficient documentation

## 2013-12-26 DIAGNOSIS — K3189 Other diseases of stomach and duodenum: Secondary | ICD-10-CM | POA: Insufficient documentation

## 2013-12-26 MED ORDER — TRAMADOL HCL 50 MG PO TABS: 50.0000 mg | ORAL_TABLET | Freq: Three times a day (TID) | ORAL | Status: DC | PRN

## 2013-12-26 MED ORDER — AMOXICILLIN 500 MG PO CAPS
1000.0000 mg | ORAL_CAPSULE | Freq: Two times a day (BID) | ORAL | Status: DC
Start: 2013-12-26 — End: 2014-07-30

## 2013-12-26 MED ORDER — CLARITHROMYCIN 500 MG PO TABS: 500.0000 mg | ORAL_TABLET | Freq: Two times a day (BID) | ORAL | Status: DC

## 2013-12-26 NOTE — Progress Notes (Signed)
Patient here for follow up Has an IUD implanted that can not be taken out Causes her to have pain in her stomach and back

## 2013-12-26 NOTE — Progress Notes (Signed)
MRN: [id number] Name: Leah Cain  Sex: female Age: 43 y.o. DOB: 1971-09-12  Allergies: Review of patient's allergies indicates no known allergies.  Chief Complaint  Patient presents with  . Follow-up    HPI: Patient is 43 y.o. female who comes today for followup, she is history of IUD in uterus which has migrated, already following her with the gynecologist at this point we have an option to go for surgery, family is still deciding, she also has lot of postprandial epigastric pain and bloating symptoms, she was started on Protonix recently as well as had a stool antigen test done for H. pylori which I reviewed has come back positive, she has not noticed any improvement with being on Protonix, she is also requesting refill on tramadol which helps her with her low back pain, denies any nausea vomiting.   Past Medical History  Diagnosis Date  . Eczema     Past Surgical History  Procedure Laterality Date  . Cervical prolapse  2006      Medication List       This list is accurate as of: 12/26/13  4:41 PM.  Always use your most recent med list.               amoxicillin 500 MG capsule  Commonly known as:  AMOXIL  Take 2 capsules (1,000 mg total) by mouth 2 (two) times daily.     clarithromycin 500 MG tablet  Commonly known as:  BIAXIN  Take 1 tablet (500 mg total) by mouth 2 (two) times daily.     fluticasone 50 MCG/ACT nasal spray  Commonly known as:  FLONASE  Place 2 sprays into both nostrils daily.     pantoprazole 40 MG tablet  Commonly known as:  PROTONIX  Take 1 tablet (40 mg total) by mouth 2 (two) times daily.     traMADol 50 MG tablet  Commonly known as:  ULTRAM  Take 1 tablet (50 mg total) by mouth every 8 (eight) hours as needed.        Meds ordered this encounter  Medications  . traMADol (ULTRAM) 50 MG tablet    Sig: Take 1 tablet (50 mg total) by mouth every 8 (eight) hours as needed.    Dispense:  40 tablet    Refill:  0  . clarithromycin  (BIAXIN) 500 MG tablet    Sig: Take 1 tablet (500 mg total) by mouth 2 (two) times daily.    Dispense:  28 tablet    Refill:  0  . amoxicillin (AMOXIL) 500 MG capsule    Sig: Take 2 capsules (1,000 mg total) by mouth 2 (two) times daily.    Dispense:  56 capsule    Refill:  0     There is no immunization history on file for this patient.  Family History  Problem Relation Age of Onset  . Stroke Father     History  Substance Use Topics  . Smoking status: Never Smoker   . Smokeless tobacco: Not on file  . Alcohol Use: No    Review of Systems   As noted in HPI  Filed Vitals:   12/26/13 1609  BP: 116/81  Pulse: 72  Temp: 98 F (36.7 C)  Resp: 16    Physical Exam  Physical Exam  Constitutional: No distress.  Eyes: EOM are normal. Pupils are equal, round, and reactive to light.  Cardiovascular: Normal rate and regular rhythm.   Pulmonary/Chest: Breath sounds normal. No respiratory  distress. She has no wheezes. She has no rales.  Abdominal: Bowel sounds are normal.  Minimal periumbilical tenderness no rebound or guarding    CBC    Component Value Date/Time   WBC 12.7* 08/31/2013 1646   RBC 4.52 08/31/2013 1646   HGB 13.0 08/31/2013 1646   HCT 39.3 08/31/2013 1646   PLT 365 08/31/2013 1646   MCV 86.9 08/31/2013 1646    CMP     Component Value Date/Time   NA 138 08/31/2013 1655   K 3.6 08/31/2013 1655   CL 104 08/31/2013 1655   CO2 26 08/31/2013 1655   GLUCOSE 100* 08/31/2013 1655   BUN 13 08/31/2013 1655   CREATININE 0.55 08/31/2013 1655   CALCIUM 9.1 08/31/2013 1655   PROT 6.9 08/31/2013 1655   ALBUMIN 4.3 08/31/2013 1655   AST 14 08/31/2013 1655   ALT 9 08/31/2013 1655   ALKPHOS 73 08/31/2013 1655   BILITOT 0.3 08/31/2013 1655    No results found for this basename: chol, tri, ldl    No components found with this basename: hga1c    Lab Results  Component Value Date/Time   AST 14 08/31/2013  4:55 PM    Assessment and Plan  Follow  up  Dyspepsia  Positive H. pylori test - Plan: She will continue with Protonix, I have started patient on 2 weeks of clarithromycin and Amoxil clarithromycin (BIAXIN) 500 MG tablet, amoxicillin (AMOXIL) 500 MG capsule  Postprandial RUQ pain - Plan: traMADol (ULTRAM) 50 MG tablet  If she is symptomatically not improved then we'll consider referral to GI for endoscopy.  Return in about 1 month (around 01/26/2014).  Lorayne Marek, MD

## 2014-01-30 ENCOUNTER — Ambulatory Visit: Payer: Self-pay | Attending: Internal Medicine | Admitting: Internal Medicine

## 2014-01-30 VITALS — BP 109/73 | HR 76 | Temp 97.0°F | Resp 17

## 2014-01-30 DIAGNOSIS — M545 Low back pain, unspecified: Secondary | ICD-10-CM | POA: Insufficient documentation

## 2014-01-30 DIAGNOSIS — M533 Sacrococcygeal disorders, not elsewhere classified: Secondary | ICD-10-CM

## 2014-01-30 DIAGNOSIS — R1011 Right upper quadrant pain: Secondary | ICD-10-CM | POA: Insufficient documentation

## 2014-01-30 DIAGNOSIS — R0981 Nasal congestion: Secondary | ICD-10-CM

## 2014-01-30 DIAGNOSIS — J3489 Other specified disorders of nose and nasal sinuses: Secondary | ICD-10-CM | POA: Insufficient documentation

## 2014-01-30 LAB — BASIC METABOLIC PANEL
BUN: 15 mg/dL (ref 6–23)
CALCIUM: 9.5 mg/dL (ref 8.4–10.5)
CHLORIDE: 104 meq/L (ref 96–112)
CO2: 26 mEq/L (ref 19–32)
CREATININE: 0.51 mg/dL (ref 0.50–1.10)
Glucose, Bld: 93 mg/dL (ref 70–99)
Potassium: 4.8 mEq/L (ref 3.5–5.3)
Sodium: 137 mEq/L (ref 135–145)

## 2014-01-30 MED ORDER — FLUTICASONE PROPIONATE 50 MCG/ACT NA SUSP: 2.0000 | Freq: Every day | NASAL | Status: DC

## 2014-01-30 MED ORDER — TRIAMCINOLONE ACETONIDE 0.025 % EX OINT: 1.0000 "application " | TOPICAL_OINTMENT | Freq: Two times a day (BID) | CUTANEOUS | Status: DC

## 2014-01-30 MED ORDER — TRAMADOL HCL 50 MG PO TABS: 50.0000 mg | ORAL_TABLET | Freq: Two times a day (BID) | ORAL | Status: DC | PRN

## 2014-01-30 MED ORDER — CYCLOBENZAPRINE HCL 10 MG PO TABS: 10.0000 mg | ORAL_TABLET | Freq: Every day | ORAL | Status: DC

## 2014-01-30 MED ORDER — CETIRIZINE HCL 10 MG PO TABS: 10.0000 mg | ORAL_TABLET | Freq: Every day | ORAL | Status: DC

## 2014-01-30 NOTE — Progress Notes (Signed)
Patient here for follow up Here with interpreter Complains of cough  Bilateral hands are dry

## 2014-01-30 NOTE — Progress Notes (Signed)
Patient ID: Leah Cain, female   DOB: Mar 26, 1971, 43 y.o.   MRN: [id number]   CC:  HPI:  43 year old female here for evaluation of her back pain and abdominal pain. Patient recently treated for H. pylori and given a two-week course of amoxicillin and clarithromycin. She reports that her symptoms are significantly better her bowel movements are more regular and she has less abdominal discomfort and bloating. She also has a history of IUD that was placed 10 years ago, but has been unable to be removed. The patient has already been seen by,Leah R Odom, MD, she was recommended not to have surgical IUD removal, neither a hysterectomy.  Patient complains of lumbosacral pain today, with radiation of the pain down into the right leg all the way to her medial first and second digit of the right foot. The patient denies any stool or urinary incontinence.  She also complains of sinus drainage, was on Flonase previously that seems to help, has not used it for a while. Also complains of eczema on both hands, for which she is using betamethasone cream.   No Known Allergies Past Medical History  Diagnosis Date  . Eczema    Current Outpatient Prescriptions on File Prior to Visit  Medication Sig Dispense Refill  . amoxicillin (AMOXIL) 500 MG capsule Take 2 capsules (1,000 mg total) by mouth 2 (two) times daily.  56 capsule  0  . clarithromycin (BIAXIN) 500 MG tablet Take 1 tablet (500 mg total) by mouth 2 (two) times daily.  28 tablet  0  . pantoprazole (PROTONIX) 40 MG tablet Take 1 tablet (40 mg total) by mouth 2 (two) times daily.  90 tablet  3   No current facility-administered medications on file prior to visit.   Family History  Problem Relation Age of Onset  . Stroke Leah Cain    History   Social History  . Marital Status: Married    Spouse Name: N/A    Number of Children: N/A  . Years of Education: N/A   Occupational History  . Not on file.   Social History Main Topics  . Smoking  status: Never Smoker   . Smokeless tobacco: Not on file  . Alcohol Use: No  . Drug Use: No  . Sexual Activity: Not on file   Other Topics Concern  . Not on file   Social History Narrative  . No narrative on file    Review of Systems  Constitutional: Negative for fever, chills, diaphoresis, activity change, appetite change and fatigue.  HENT: Negative for ear pain, nosebleeds, congestion, facial swelling, rhinorrhea, neck pain, neck stiffness and ear discharge.   Eyes: Negative for pain, discharge, redness, itching and visual disturbance.  Respiratory: Negative for cough, choking, chest tightness, shortness of breath, wheezing and stridor.   Cardiovascular: Negative for chest pain, palpitations and leg swelling.  Gastrointestinal: Negative for abdominal distention.  Genitourinary: Negative for dysuria, urgency, frequency, hematuria, flank pain, decreased urine volume, difficulty urinating and dyspareunia.  Musculoskeletal: Negative for back pain, joint swelling, arthralgias and gait problem.  Neurological: Negative for dizziness, tremors, seizures, syncope, facial asymmetry, speech difficulty, weakness, light-headedness, numbness and headaches.  Hematological: Negative for adenopathy. Does not bruise/bleed easily.  Psychiatric/Behavioral: Negative for hallucinations, behavioral problems, confusion, dysphoric mood, decreased concentration and agitation.    Objective:   Filed Vitals:   01/30/14 1115  BP: 109/73  Pulse: 76  Temp: 97 F (36.1 C)  Resp: 17    Physical Exam  Constitutional: Appears well-developed and  well-nourished. No distress.  HENT: Normocephalic. External right and left ear normal. Oropharynx is clear and moist.  Eyes: Conjunctivae and EOM are normal. PERRLA, no scleral icterus.  Neck: Normal ROM. Neck supple. No JVD. No tracheal deviation. No thyromegaly.  CVS: RRR, S1/S2 +, no murmurs, no gallops, no carotid bruit.  Pulmonary: Effort and breath sounds  normal, no stridor, rhonchi, wheezes, rales.  Abdominal: Soft. BS +,  no distension, tenderness, rebound or guarding.  Musculoskeletal: Normal range of motion. No edema and no tenderness.  Lymphadenopathy: No lymphadenopathy noted, cervical, inguinal. Neuro: Alert. Normal reflexes, muscle tone coordination. No cranial nerve deficit. Skin: Skin is warm and dry. No rash noted. Not diaphoretic. No erythema. No pallor.  Psychiatric: Normal mood and affect. Behavior, judgment, thought content normal.   Lab Results  Component Value Date   WBC 12.7* 08/31/2013   HGB 13.0 08/31/2013   HCT 39.3 08/31/2013   MCV 86.9 08/31/2013   PLT 365 08/31/2013   Lab Results  Component Value Date   CREATININE 0.55 08/31/2013   BUN 13 08/31/2013   NA 138 08/31/2013   K 3.6 08/31/2013   CL 104 08/31/2013   CO2 26 08/31/2013    Lab Results  Component Value Date   HGBA1C 5.1 08/31/2013   Lipid Panel  No results found for this basename: chol, trig, hdl, cholhdl, vldl, ldlcalc       Assessment and plan:   Patient Active Problem List   Diagnosis Date Noted  . Positive H. pylori test 12/26/2013  . IUD migration 09/19/2013  . Postprandial epigastric pain 09/19/2013  . Postprandial RUQ pain 09/19/2013   Eczematous dermatitis of both hands Patient advised to continue betamethasone cream which she brought from [place] Prescription for triamcinolone provided    Back pain Complaining of lumbar radiculopathy/sciatica Refill tramadol Started the patient on Flexeril at bedtime, currently using her husbands Flexeril CT lumbar, abdomen and pelvis Avoid MRI because of IUD   Allergic rhinitis Continue Flonase Started the patient on zyrtec   The patient was given clear instructions to go to ER or return to medical center if symptoms don't improve, worsen or new problems develop. The patient verbalized understanding. The patient was told to call to get any lab results if not heard anything in the  next week.

## 2014-02-03 ENCOUNTER — Ambulatory Visit (HOSPITAL_COMMUNITY): Admission: RE | Admit: 2014-02-03 | Payer: Self-pay | Source: Ambulatory Visit

## 2014-03-02 ENCOUNTER — Telehealth: Payer: Self-pay

## 2014-03-02 ENCOUNTER — Other Ambulatory Visit: Payer: Self-pay

## 2014-03-02 ENCOUNTER — Telehealth: Payer: Self-pay | Admitting: Internal Medicine

## 2014-03-02 DIAGNOSIS — R1011 Right upper quadrant pain: Secondary | ICD-10-CM

## 2014-03-02 MED ORDER — TRAMADOL HCL 50 MG PO TABS: 50.0000 mg | ORAL_TABLET | Freq: Two times a day (BID) | ORAL | Status: DC | PRN

## 2014-03-02 NOTE — Telephone Encounter (Signed)
Pt came into the office requesting a refill of her tramadol medication, pt is in severe pain and she needs the medication as soon as possible. Pt is in office waiting for the script to be printed

## 2014-03-02 NOTE — Telephone Encounter (Signed)
Patient showed up in office needing a refill on her tramadol Told her she would need to return tomorrow when her provider will be in

## 2014-04-05 ENCOUNTER — Telehealth: Payer: Self-pay | Admitting: Internal Medicine

## 2014-04-05 NOTE — Telephone Encounter (Signed)
PT. Has ran out of medication (Tramadol).Marland KitchenMarland KitchenMarland KitchenPlease call patient if medication can be refilled.

## 2014-04-06 ENCOUNTER — Telehealth: Payer: Self-pay

## 2014-04-06 ENCOUNTER — Other Ambulatory Visit: Payer: Self-pay

## 2014-04-06 DIAGNOSIS — R1011 Right upper quadrant pain: Secondary | ICD-10-CM

## 2014-04-06 MED ORDER — TRAMADOL HCL 50 MG PO TABS: 50.0000 mg | ORAL_TABLET | Freq: Two times a day (BID) | ORAL | Status: DC | PRN

## 2014-04-06 NOTE — Telephone Encounter (Signed)
Patient arrived here in office today requesting a refill on her tramadol Prescription refilled

## 2014-04-25 ENCOUNTER — Ambulatory Visit: Payer: Self-pay | Admitting: Internal Medicine

## 2014-06-14 ENCOUNTER — Ambulatory Visit: Payer: Self-pay | Attending: Internal Medicine | Admitting: Internal Medicine

## 2014-06-14 ENCOUNTER — Encounter: Payer: Self-pay | Admitting: Internal Medicine

## 2014-06-14 VITALS — BP 119/79 | HR 60 | Temp 98.9°F | Resp 16 | Wt 169.6 lb

## 2014-06-14 DIAGNOSIS — G8929 Other chronic pain: Secondary | ICD-10-CM | POA: Insufficient documentation

## 2014-06-14 DIAGNOSIS — M5430 Sciatica, unspecified side: Secondary | ICD-10-CM | POA: Insufficient documentation

## 2014-06-14 DIAGNOSIS — M545 Low back pain, unspecified: Secondary | ICD-10-CM | POA: Insufficient documentation

## 2014-06-14 DIAGNOSIS — L259 Unspecified contact dermatitis, unspecified cause: Secondary | ICD-10-CM | POA: Insufficient documentation

## 2014-06-14 DIAGNOSIS — M62830 Muscle spasm of back: Secondary | ICD-10-CM

## 2014-06-14 DIAGNOSIS — M5380 Other specified dorsopathies, site unspecified: Secondary | ICD-10-CM | POA: Insufficient documentation

## 2014-06-14 MED ORDER — BACLOFEN 10 MG PO TABS: 10.0000 mg | ORAL_TABLET | Freq: Every day | ORAL | Status: DC

## 2014-06-14 MED ORDER — TRAMADOL HCL 50 MG PO TABS: 50.0000 mg | ORAL_TABLET | Freq: Two times a day (BID) | ORAL | Status: DC | PRN

## 2014-06-14 MED ORDER — DICLOFENAC SODIUM 75 MG PO TBEC: 75.0000 mg | DELAYED_RELEASE_TABLET | Freq: Two times a day (BID) | ORAL | Status: DC

## 2014-06-14 NOTE — Progress Notes (Signed)
MRN: [id number] Name: Leah Cain  Sex: female Age: 43 y.o. DOB: Mar 23, 1971  Allergies: Review of patient's allergies indicates no known allergies.  Chief Complaint  Patient presents with  . Back Pain    HPI: Patient is 43 y.o. female who comes today reported to have worsening lower back pain sometimes she has pain radiating down to the left leg, she denies any incontinence denies any fever chills, she denies any recent fall or trauma, she is requesting refill on tramadol which helps her with the symptoms, she has tried Flexeril in the past as per patient it makes him more drowsy.  Past Medical History  Diagnosis Date  . Eczema     Past Surgical History  Procedure Laterality Date  . Cervical prolapse  2006      Medication List       This list is accurate as of: 06/14/14  5:54 PM.  Always use your most recent med list.               amoxicillin 500 MG capsule  Commonly known as:  AMOXIL  Take 2 capsules (1,000 mg total) by mouth 2 (two) times daily.     baclofen 10 MG tablet  Commonly known as:  LIORESAL  Take 1 tablet (10 mg total) by mouth at bedtime.     cetirizine 10 MG tablet  Commonly known as:  ZYRTEC  Take 1 tablet (10 mg total) by mouth daily.     clarithromycin 500 MG tablet  Commonly known as:  BIAXIN  Take 1 tablet (500 mg total) by mouth 2 (two) times daily.     cyclobenzaprine 10 MG tablet  Commonly known as:  FLEXERIL  Take 1 tablet (10 mg total) by mouth at bedtime.     diclofenac 75 MG EC tablet  Commonly known as:  VOLTAREN  Take 1 tablet (75 mg total) by mouth 2 (two) times daily.     fluticasone 50 MCG/ACT nasal spray  Commonly known as:  FLONASE  Place 2 sprays into both nostrils daily.     pantoprazole 40 MG tablet  Commonly known as:  PROTONIX  Take 1 tablet (40 mg total) by mouth 2 (two) times daily.     traMADol 50 MG tablet  Commonly known as:  ULTRAM  Take 1 tablet (50 mg total) by mouth every 12 (twelve) hours as  needed.     triamcinolone 0.025 % ointment  Commonly known as:  KENALOG  Apply 1 application topically 2 (two) times daily. Both hands        Meds ordered this encounter  Medications  . traMADol (ULTRAM) 50 MG tablet    Sig: Take 1 tablet (50 mg total) by mouth every 12 (twelve) hours as needed.    Dispense:  60 tablet    Refill:  0  . baclofen (LIORESAL) 10 MG tablet    Sig: Take 1 tablet (10 mg total) by mouth at bedtime.    Dispense:  30 each    Refill:  3  . diclofenac (VOLTAREN) 75 MG EC tablet    Sig: Take 1 tablet (75 mg total) by mouth 2 (two) times daily.    Dispense:  30 tablet    Refill:  3     There is no immunization history on file for this patient.  Family History  Problem Relation Age of Onset  . Stroke Father     History  Substance Use Topics  . Smoking status: Never Smoker   .  Smokeless tobacco: Not on file  . Alcohol Use: No    Review of Systems   As noted in HPI  Filed Vitals:   06/14/14 1725  BP: 119/79  Pulse: 60  Temp: 98.9 F (37.2 C)  Resp: 16    Physical Exam  Physical Exam  Constitutional: No distress.  Eyes: EOM are normal. Pupils are equal, round, and reactive to light.  Cardiovascular: Normal rate and regular rhythm.   Pulmonary/Chest: Breath sounds normal. No respiratory distress. She has no wheezes. She has no rales.  Musculoskeletal:  Patient declines to do SLR test and is afraid it is going to make pain worse, equal strength both lower extremities DTR 2+    CBC    Component Value Date/Time   WBC 12.7* 08/31/2013 1646   RBC 4.52 08/31/2013 1646   HGB 13.0 08/31/2013 1646   HCT 39.3 08/31/2013 1646   PLT 365 08/31/2013 1646   MCV 86.9 08/31/2013 1646    CMP     Component Value Date/Time   NA 137 01/30/2014 1145   K 4.8 01/30/2014 1145   CL 104 01/30/2014 1145   CO2 26 01/30/2014 1145   GLUCOSE 93 01/30/2014 1145   BUN 15 01/30/2014 1145   CREATININE 0.51 01/30/2014 1145   CALCIUM 9.5 01/30/2014 1145   PROT  6.9 08/31/2013 1655   ALBUMIN 4.3 08/31/2013 1655   AST 14 08/31/2013 1655   ALT 9 08/31/2013 1655   ALKPHOS 73 08/31/2013 1655   BILITOT 0.3 08/31/2013 1655   GFRNONAA >89 08/31/2013 1655   GFRAA >89 08/31/2013 1655    No results found for this basename: chol, tri, ldl    No components found with this basename: hga1c    Lab Results  Component Value Date/Time   AST 14 08/31/2013  4:55 PM    Assessment and Plan  Chronic low back pain - Plan: traMADol (ULTRAM) 50 MG tablet, I prescribed diclofenac (VOLTAREN) 75 MG EC tablet   Back muscle spasm - Plan: Trial of baclofen (LIORESAL) 10 MG tablet each bedtime   Return in about 3 months (around 09/14/2014) for back pain.  Lorayne Marek, MD

## 2014-06-14 NOTE — Progress Notes (Signed)
Patient complains of back pain that radiates down her right leg Patient states her pain is a  "10" on the pain scale

## 2014-06-15 DIAGNOSIS — G8929 Other chronic pain: Secondary | ICD-10-CM | POA: Insufficient documentation

## 2014-06-15 DIAGNOSIS — M545 Low back pain: Secondary | ICD-10-CM

## 2014-07-17 ENCOUNTER — Telehealth: Payer: Self-pay | Admitting: Internal Medicine

## 2014-07-17 NOTE — Telephone Encounter (Signed)
Pt calling for traMADol (ULTRAM) 50 MG tablet refill, husband says she is in a lot of pain and has no more medication left.  Please f/u with pt.

## 2014-07-17 NOTE — Telephone Encounter (Signed)
Pt requesting refill on Tramadol. Please f/u

## 2014-07-18 ENCOUNTER — Telehealth: Payer: Self-pay

## 2014-07-18 ENCOUNTER — Other Ambulatory Visit: Payer: Self-pay

## 2014-07-18 ENCOUNTER — Telehealth: Payer: Self-pay | Admitting: Internal Medicine

## 2014-07-18 DIAGNOSIS — M545 Low back pain, unspecified: Secondary | ICD-10-CM

## 2014-07-18 DIAGNOSIS — G8929 Other chronic pain: Secondary | ICD-10-CM

## 2014-07-18 MED ORDER — TRAMADOL HCL 50 MG PO TABS: 50.0000 mg | ORAL_TABLET | Freq: Two times a day (BID) | ORAL | Status: DC | PRN

## 2014-07-18 NOTE — Telephone Encounter (Signed)
Patient showed up in the office requesting a refill on her tramadol For her chronic back pain Prescription refilled

## 2014-07-18 NOTE — Telephone Encounter (Signed)
Patient has come in today requesting a medication refill for traMADol (ULTRAM) 50 MG tablet; patient did not get any sleep last night and is awaiting approval for refill; please f/u with patient in the lobby

## 2014-07-30 ENCOUNTER — Ambulatory Visit (INDEPENDENT_AMBULATORY_CARE_PROVIDER_SITE_OTHER): Payer: Self-pay | Admitting: Emergency Medicine

## 2014-07-30 VITALS — BP 114/73 | HR 64 | Temp 98.2°F | Resp 16 | Ht 62.5 in | Wt 167.8 lb

## 2014-07-30 DIAGNOSIS — S39012A Strain of muscle, fascia and tendon of lower back, initial encounter: Secondary | ICD-10-CM

## 2014-07-30 DIAGNOSIS — S338XXA Sprain of other parts of lumbar spine and pelvis, initial encounter: Secondary | ICD-10-CM

## 2014-07-30 MED ORDER — NAPROXEN SODIUM 550 MG PO TABS: 550.0000 mg | ORAL_TABLET | Freq: Two times a day (BID) | ORAL | Status: DC

## 2014-07-30 MED ORDER — HYDROCODONE-ACETAMINOPHEN 5-325 MG PO TABS: 1.0000 | ORAL_TABLET | ORAL | Status: DC | PRN

## 2014-07-30 NOTE — Progress Notes (Signed)
[facility] [address] [place] [address] [phone]  Date:  07/30/2014   Name:  Leah Cain   DOB:  27-Oct-1970   MRN:  [id number]  PCP:  Lorayne Marek, MD    Chief Complaint: Back Pain   History of Present Illness:  Leah Cain is a 43 y.o. very pleasant female patient who presents with the following:  History of low back pain treated with "oxycodone 15 qid" for a month and the pain was gone. Now has recurrent pain in low back with radiation into the left leg to the foot.   No history of injury or overuse. No numbness or weakness in leg. No improvement with over the counter medications or other home remedies. Denies other complaint or health concern today.   Patient Active Problem List   Diagnosis Date Noted  . Chronic low back pain 06/15/2014  . Positive H. pylori test 12/26/2013  . IUD migration 09/19/2013  . Postprandial epigastric pain 09/19/2013  . Postprandial RUQ pain 09/19/2013    Past Medical History  Diagnosis Date  . Eczema     Past Surgical History  Procedure Laterality Date  . Cervical prolapse  2006    History  Substance Use Topics  . Smoking status: Never Smoker   . Smokeless tobacco: Not on file  . Alcohol Use: No    Family History  Problem Relation Age of Onset  . Stroke Father     No Known Allergies  Medication list has been reviewed and updated.  Current Outpatient Prescriptions on File Prior to Visit  Medication Sig Dispense Refill  . baclofen (LIORESAL) 10 MG tablet Take 1 tablet (10 mg total) by mouth at bedtime.  30 each  3  . cetirizine (ZYRTEC) 10 MG tablet Take 1 tablet (10 mg total) by mouth daily.  30 tablet  11  . cyclobenzaprine (FLEXERIL) 10 MG tablet Take 1 tablet (10 mg total) by mouth at bedtime.  30 tablet  0  . diclofenac (VOLTAREN) 75 MG EC tablet Take 1 tablet (75 mg total) by mouth 2 (two) times daily.  30 tablet  3  . fluticasone (FLONASE) 50 MCG/ACT nasal spray Place 2 sprays into  both nostrils daily.  16 g  3  . pantoprazole (PROTONIX) 40 MG tablet Take 1 tablet (40 mg total) by mouth 2 (two) times daily.  90 tablet  3  . traMADol (ULTRAM) 50 MG tablet Take 1 tablet (50 mg total) by mouth every 12 (twelve) hours as needed.  60 tablet  0  . triamcinolone (KENALOG) 0.025 % ointment Apply 1 application topically 2 (two) times daily. Both hands  30 g  2   No current facility-administered medications on file prior to visit.    Review of Systems:  As per HPI, otherwise negative.    Physical Examination: Filed Vitals:   07/30/14 1509  BP: 114/73  Pulse: 64  Temp: 98.2 F (36.8 C)  Resp: 16   Filed Vitals:   07/30/14 1509  Height: 5' 2.5" (1.588 m)  Weight: 167 lb 12.8 oz (76.114 kg)   Body mass index is 30.18 kg/(m^2). Ideal Body Weight: Weight in (lb) to have BMI = 25: 138.6   GEN: WDWN, NAD, Non-toxic, Alert & Oriented x 3 HEENT: Atraumatic, Normocephalic.  Ears and Nose: No external deformity. EXTR: No clubbing/cyanosis/edema NEURO: Normal gait.  PSYCH: Normally interactive. Conversant. Not depressed or anxious appearing.  Calm demeanor.  Overly tender in lumbar paraspinous muscles bilaterally  worse on left. Neuro some weakness extension on left.  DTR symmetrical   Assessment and Plan: Lumbar strain Flexeril Anaprox vicodin Ice Follow up in one week.  Signed,  Ellison Carwin, MD

## 2014-07-30 NOTE — Patient Instructions (Signed)

## 2014-08-02 ENCOUNTER — Telehealth: Payer: Self-pay

## 2014-08-02 NOTE — Telephone Encounter (Signed)
Pt stated that she has an appointment with another doctor for her pain but they cant see her until sometime in November and she needs a refill on her HYDROcodone-acetaminophen ([place]) 5-325 MG per tablet to last her until she can see the specialist.  Her call back number is [phone].

## 2014-08-02 NOTE — Telephone Encounter (Signed)
She must come in for a revisit

## 2014-08-03 NOTE — Telephone Encounter (Signed)
Spoke with husband. It is ok for him to see anyone. Unable to come in due to no ins. Pt has been taking Aleve with success. Will continue taking that prn.

## 2014-08-03 NOTE — Telephone Encounter (Signed)
Patient spouse came into [facility] to ask if he can get his spouses medication refilled desperately because she is in a lot of pain and has ran out of hydrocodone. I informed patient that she needs an office visit per Dr. Ouida Sills. Patients spouse asked if they had to pay in full. I said yes and he asked if they can see a different doctor then.

## 2014-09-04 ENCOUNTER — Telehealth: Payer: Self-pay | Admitting: Internal Medicine

## 2014-09-04 NOTE — Telephone Encounter (Signed)
Pt. Came into facility to request a refill for traMADol (ULTRAM) 50 MG tablet. Please f/u with pt.

## 2014-09-05 ENCOUNTER — Telehealth: Payer: Self-pay | Admitting: Internal Medicine

## 2014-09-05 ENCOUNTER — Telehealth: Payer: Self-pay | Admitting: Emergency Medicine

## 2014-09-05 DIAGNOSIS — G8929 Other chronic pain: Secondary | ICD-10-CM

## 2014-09-05 DIAGNOSIS — M545 Low back pain, unspecified: Secondary | ICD-10-CM

## 2014-09-05 MED ORDER — TRAMADOL HCL 50 MG PO TABS: 50.0000 mg | ORAL_TABLET | Freq: Two times a day (BID) | ORAL | Status: DC | PRN

## 2014-09-05 NOTE — Telephone Encounter (Signed)
Pt is calling to request a refill traMADol (ULTRAM) 50 MG tablet. Please follow up with patient.

## 2014-09-05 NOTE — Telephone Encounter (Signed)
Expand All Collapse All   Pt. Came into facility to request a refill for traMADol (ULTRAM) 50 MG tablet. Please f/u with pt.        Please f/u

## 2014-09-05 NOTE — Telephone Encounter (Signed)
Pt informed she can pick medication Tramadol at front desk

## 2014-10-05 ENCOUNTER — Telehealth: Payer: Self-pay | Admitting: Internal Medicine

## 2014-10-05 NOTE — Telephone Encounter (Signed)
Pt's husband called to request a refill for traMADol (ULTRAM) 50 MG tablet, please f/u with pt.

## 2014-10-06 ENCOUNTER — Other Ambulatory Visit: Payer: Self-pay | Admitting: Internal Medicine

## 2014-10-06 ENCOUNTER — Encounter: Payer: Self-pay | Admitting: *Deleted

## 2014-10-06 DIAGNOSIS — G8929 Other chronic pain: Secondary | ICD-10-CM

## 2014-10-06 DIAGNOSIS — M545 Low back pain, unspecified: Secondary | ICD-10-CM

## 2014-10-06 MED ORDER — TRAMADOL HCL 50 MG PO TABS: 50.0000 mg | ORAL_TABLET | Freq: Two times a day (BID) | ORAL | Status: DC | PRN

## 2014-10-06 NOTE — Progress Notes (Signed)
Pt's husband came in the office and got his wife's pain medication.

## 2014-10-06 NOTE — Telephone Encounter (Signed)
Patient  Has come in today to see if she can receive a medication refill for traMADol (ULTRAM) 50 MG tablet; please f/u with patient in the lobby

## 2014-10-06 NOTE — Addendum Note (Signed)
Addended by: Alverda Skeans on: 10/06/2014 02:30 PM   Modules accepted: Orders

## 2014-10-30 ENCOUNTER — Telehealth: Payer: Self-pay

## 2014-10-30 NOTE — Telephone Encounter (Signed)
Patient calling for refill on her tramadol Patient has not been seen her since April Patient will need to schedule an appointment

## 2014-11-08 ENCOUNTER — Ambulatory Visit: Payer: Self-pay | Attending: Internal Medicine | Admitting: Internal Medicine

## 2014-11-08 ENCOUNTER — Encounter: Payer: Self-pay | Admitting: Internal Medicine

## 2014-11-08 VITALS — BP 117/81 | HR 60 | Temp 97.9°F | Resp 16 | Wt 180.0 lb

## 2014-11-08 DIAGNOSIS — R0981 Nasal congestion: Secondary | ICD-10-CM | POA: Insufficient documentation

## 2014-11-08 DIAGNOSIS — G8929 Other chronic pain: Secondary | ICD-10-CM | POA: Insufficient documentation

## 2014-11-08 DIAGNOSIS — Z1231 Encounter for screening mammogram for malignant neoplasm of breast: Secondary | ICD-10-CM

## 2014-11-08 DIAGNOSIS — M545 Low back pain, unspecified: Secondary | ICD-10-CM

## 2014-11-08 MED ORDER — TRAMADOL HCL 50 MG PO TABS: 50.0000 mg | ORAL_TABLET | Freq: Two times a day (BID) | ORAL | Status: DC | PRN

## 2014-11-08 MED ORDER — FLUTICASONE PROPIONATE 50 MCG/ACT NA SUSP: 2.0000 | Freq: Every day | NASAL | Status: DC

## 2014-11-08 NOTE — Progress Notes (Signed)
Patient here with husband who interprets for her Here for a refill on her tramadol

## 2014-11-08 NOTE — Progress Notes (Signed)
MRN: [id number] Name: Leah Cain  Sex: female Age: 44 y.o. DOB: [birth year]  Allergies: Review of patient's allergies indicates no known allergies.  Chief Complaint  Patient presents with  . Medication Refill    HPI: Patient is 44 y.o. female who history of chronic lower back pain comes today requesting refill on her pain medication, also as per patient she had some cold symptoms for the last one week but it is getting better denies any fever chills chest and shortness of breath has some nasal congestion and is blowing her nose a lot.  Past Medical History  Diagnosis Date  . Eczema     Past Surgical History  Procedure Laterality Date  . Cervical prolapse  2006      Medication List       This list is accurate as of: 11/08/14 10:59 AM.  Always use your most recent med list.               baclofen 10 MG tablet  Commonly known as:  LIORESAL  Take 1 tablet (10 mg total) by mouth at bedtime.     cetirizine 10 MG tablet  Commonly known as:  ZYRTEC  Take 1 tablet (10 mg total) by mouth daily.     cyclobenzaprine 10 MG tablet  Commonly known as:  FLEXERIL  Take 1 tablet (10 mg total) by mouth at bedtime.     diclofenac 75 MG EC tablet  Commonly known as:  VOLTAREN  Take 1 tablet (75 mg total) by mouth 2 (two) times daily.     fluticasone 50 MCG/ACT nasal spray  Commonly known as:  FLONASE  Place 2 sprays into both nostrils daily.     HYDROcodone-acetaminophen 5-325 MG per tablet  Commonly known as:  NORCO  Take 1-2 tablets by mouth every 4 (four) hours as needed.     naproxen sodium 550 MG tablet  Commonly known as:  ANAPROX DS  Take 1 tablet (550 mg total) by mouth 2 (two) times daily with a meal.     pantoprazole 40 MG tablet  Commonly known as:  PROTONIX  Take 1 tablet (40 mg total) by mouth 2 (two) times daily.     traMADol 50 MG tablet  Commonly known as:  ULTRAM  Take 1 tablet (50 mg total) by mouth every 12 (twelve) hours as needed.     triamcinolone 0.025 % ointment  Commonly known as:  KENALOG  Apply 1 application topically 2 (two) times daily. Both hands        Meds ordered this encounter  Medications  . traMADol (ULTRAM) 50 MG tablet    Sig: Take 1 tablet (50 mg total) by mouth every 12 (twelve) hours as needed.    Dispense:  60 tablet    Refill:  0  . fluticasone (FLONASE) 50 MCG/ACT nasal spray    Sig: Place 2 sprays into both nostrils daily.    Dispense:  16 g    Refill:  3     There is no immunization history on file for this patient.  Family History  Problem Relation Age of Onset  . Stroke Father     History  Substance Use Topics  . Smoking status: Never Smoker   . Smokeless tobacco: Not on file  . Alcohol Use: No    Review of Systems   As noted in HPI  Filed Vitals:   11/08/14 1042  BP: 117/81  Pulse: 60  Temp: 97.9 F (36.6 C)  Resp: 16    Physical Exam  Physical Exam  Constitutional: No distress.  HENT:  Nasal congestion no sinus tenderness  Eyes: EOM are normal. Pupils are equal, round, and reactive to light.  Cardiovascular: Normal rate and regular rhythm.   Pulmonary/Chest: Breath sounds normal. No respiratory distress. She has no wheezes. She has no rales.  Musculoskeletal: She exhibits no edema.    CBC    Component Value Date/Time   WBC 12.7* 08/31/2013 1646   RBC 4.52 08/31/2013 1646   HGB 13.0 08/31/2013 1646   HCT 39.3 08/31/2013 1646   PLT 365 08/31/2013 1646   MCV 86.9 08/31/2013 1646    CMP     Component Value Date/Time   NA 137 01/30/2014 1145   K 4.8 01/30/2014 1145   CL 104 01/30/2014 1145   CO2 26 01/30/2014 1145   GLUCOSE 93 01/30/2014 1145   BUN 15 01/30/2014 1145   CREATININE 0.51 01/30/2014 1145   CALCIUM 9.5 01/30/2014 1145   PROT 6.9 08/31/2013 1655   ALBUMIN 4.3 08/31/2013 1655   AST 14 08/31/2013 1655   ALT 9 08/31/2013 1655   ALKPHOS 73 08/31/2013 1655   BILITOT 0.3 08/31/2013 1655   GFRNONAA >89 08/31/2013 1655   GFRAA >89  08/31/2013 1655    No results found for: CHOL  No components found for: HGA1C  Lab Results  Component Value Date/Time   AST 14 08/31/2013 04:55 PM    Assessment and Plan  Chronic low back pain - Plan: patient is given refill on the medication traMADol (ULTRAM) 50 MG tablet  Nasal congestion - Plan:trial of fluticasone (FLONASE) 50 MCG/ACT nasal spray   Health Maintenance  -Mammogram: ordered  -Vaccinations:  Patient declined for flu shot   Return in about 4 months (around 03/09/2015), or if symptoms worsen or fail to improve.  Lorayne Marek, MD

## 2014-11-27 ENCOUNTER — Other Ambulatory Visit: Payer: Self-pay

## 2014-11-27 ENCOUNTER — Telehealth: Payer: Self-pay

## 2014-11-27 DIAGNOSIS — G8929 Other chronic pain: Secondary | ICD-10-CM

## 2014-11-27 DIAGNOSIS — M545 Low back pain: Principal | ICD-10-CM

## 2014-11-27 NOTE — Telephone Encounter (Signed)
Patient husband called requesting a refill on her tramadol Explained to husband that it is too soon to refill Will have to wait until next week for refill

## 2014-12-05 ENCOUNTER — Other Ambulatory Visit: Payer: Self-pay

## 2014-12-05 ENCOUNTER — Telehealth: Payer: Self-pay

## 2014-12-05 DIAGNOSIS — M545 Low back pain, unspecified: Secondary | ICD-10-CM

## 2014-12-05 DIAGNOSIS — G8929 Other chronic pain: Secondary | ICD-10-CM

## 2014-12-05 MED ORDER — TRAMADOL HCL 50 MG PO TABS: 50.0000 mg | ORAL_TABLET | Freq: Two times a day (BID) | ORAL | Status: DC | PRN

## 2014-12-05 NOTE — Telephone Encounter (Signed)
Patient's husband called to request a refill on his wife's tramadol Prescription printed and is at the front desk to be picked up

## 2014-12-29 ENCOUNTER — Ambulatory Visit: Payer: Self-pay | Attending: Internal Medicine

## 2014-12-29 ENCOUNTER — Other Ambulatory Visit: Payer: Self-pay

## 2015-01-01 ENCOUNTER — Telehealth: Payer: Self-pay | Admitting: General Practice

## 2015-01-01 ENCOUNTER — Other Ambulatory Visit: Payer: Self-pay

## 2015-01-01 ENCOUNTER — Telehealth: Payer: Self-pay

## 2015-01-01 DIAGNOSIS — M545 Low back pain, unspecified: Secondary | ICD-10-CM

## 2015-01-01 DIAGNOSIS — G8929 Other chronic pain: Secondary | ICD-10-CM

## 2015-01-01 MED ORDER — TRAMADOL HCL 50 MG PO TABS: 50.0000 mg | ORAL_TABLET | Freq: Two times a day (BID) | ORAL | Status: DC | PRN

## 2015-01-01 NOTE — Telephone Encounter (Signed)
Patient is aware his prescription is ready and can be picked up at the front desk

## 2015-01-01 NOTE — Telephone Encounter (Signed)
Patients husband presents to clinic to request medication refill for Tramadol. Please contact pt (or patients husband) to assist.

## 2015-01-01 NOTE — Telephone Encounter (Signed)
Husband called requesting a refill on his wife's tramadol Per Dr Annitta Needs we can refill

## 2015-01-23 ENCOUNTER — Encounter: Payer: Self-pay | Admitting: Internal Medicine

## 2015-01-23 ENCOUNTER — Ambulatory Visit: Payer: Self-pay | Attending: Internal Medicine | Admitting: Internal Medicine

## 2015-01-23 VITALS — BP 114/78 | HR 72 | Temp 98.1°F | Resp 16 | Wt 176.6 lb

## 2015-01-23 DIAGNOSIS — M79672 Pain in left foot: Secondary | ICD-10-CM | POA: Insufficient documentation

## 2015-01-23 DIAGNOSIS — K029 Dental caries, unspecified: Secondary | ICD-10-CM | POA: Insufficient documentation

## 2015-01-23 NOTE — Patient Instructions (Signed)
Plantar Fasciitis  Plantar fasciitis is a common condition that causes foot pain. It is soreness (inflammation) of the band of tough fibrous tissue on the bottom of the foot that runs from the heel bone (calcaneus) to the ball of the foot. The cause of this soreness may be from excessive standing, poor fitting shoes, running on hard surfaces, being overweight, having an abnormal walk, or overuse (this is common in runners) of the painful foot or feet. It is also common in aerobic exercise dancers and ballet dancers.  SYMPTOMS   Most people with plantar fasciitis complain of:   Severe pain in the morning on the bottom of their foot especially when taking the first steps out of bed. This pain recedes after a few minutes of walking.   Severe pain is experienced also during walking following a long period of inactivity.   Pain is worse when walking barefoot or up stairs  DIAGNOSIS    Your caregiver will diagnose this condition by examining and feeling your foot.   Special tests such as X-rays of your foot, are usually not needed.  PREVENTION    Consult a sports medicine professional before beginning a new exercise program.   Walking programs offer a good workout. With walking there is a lower chance of overuse injuries common to runners. There is less impact and less jarring of the joints.   Begin all new exercise programs slowly. If problems or pain develop, decrease the amount of time or distance until you are at a comfortable level.   Wear good shoes and replace them regularly.   Stretch your foot and the heel cords at the back of the ankle (Achilles tendon) both before and after exercise.   Run or exercise on even surfaces that are not hard. For example, asphalt is better than pavement.   Do not run barefoot on hard surfaces.   If using a treadmill, vary the incline.   Do not continue to workout if you have foot or joint problems. Seek professional help if they do not improve.  HOME CARE INSTRUCTIONS     Avoid activities that cause you pain until you recover.   Use ice or cold packs on the problem or painful areas after working out.   Only take over-the-counter or prescription medicines for pain, discomfort, or fever as directed by your caregiver.   Soft shoe inserts or athletic shoes with air or gel sole cushions may be helpful.   If problems continue or become more severe, consult a sports medicine caregiver or your own health care provider. Cortisone is a potent anti-inflammatory medication that may be injected into the painful area. You can discuss this treatment with your caregiver.  MAKE SURE YOU:    Understand these instructions.   Will watch your condition.   Will get help right away if you are not doing well or get worse.  Document Released: 07/01/2001 Document Revised: 12/29/2011 Document Reviewed: 08/30/2008  [employer] Patient Information 2015 [employer]. This information is not intended to replace advice given to you by your health care provider. Make sure you discuss any questions you have with your health care provider.

## 2015-01-23 NOTE — Progress Notes (Signed)
MRN: [id number] Name: Leah Cain  Sex: female Age: 44 y.o. DOB: 01-Dec-1970  Allergies: Review of patient's allergies indicates no known allergies.  Chief Complaint  Patient presents with  . Foot Pain    HPI: Patient is 44 y.o. 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  Past Medical History  Diagnosis Date  . Eczema     Past Surgical History  Procedure Laterality Date  . Cervical prolapse  2006      Medication List       This list is accurate as of: 01/23/15  2:00 PM.  Always use your most recent med list.               baclofen 10 MG tablet  Commonly known as:  LIORESAL  Take 1 tablet (10 mg total) by mouth at bedtime.     cetirizine 10 MG tablet  Commonly known as:  ZYRTEC  Take 1 tablet (10 mg total) by mouth daily.     cyclobenzaprine 10 MG tablet  Commonly known as:  FLEXERIL  Take 1 tablet (10 mg total) by mouth at bedtime.     diclofenac 75 MG EC tablet  Commonly known as:  VOLTAREN  Take 1 tablet (75 mg total) by mouth 2 (two) times daily.     fluticasone 50 MCG/ACT nasal spray  Commonly known as:  FLONASE  Place 2 sprays into both nostrils daily.     HYDROcodone-acetaminophen 5-325 MG per tablet  Commonly known as:  NORCO  Take 1-2 tablets by mouth every 4 (four) hours as needed.     naproxen sodium 550 MG tablet  Commonly known as:  ANAPROX DS  Take 1 tablet (550 mg total) by mouth 2 (two) times daily with a meal.     pantoprazole 40 MG tablet  Commonly known as:  PROTONIX  Take 1 tablet (40 mg total) by mouth 2 (two) times daily.     traMADol 50 MG tablet  Commonly  known as:  ULTRAM  Take 1 tablet (50 mg total) by mouth every 12 (twelve) hours as needed.     triamcinolone 0.025 % ointment  Commonly known as:  KENALOG  Apply 1 application topically 2 (two) times daily. Both hands        No orders of the defined types were placed in this encounter.     There is no immunization history on file for this patient.  Family History  Problem Relation Age of Onset  . Stroke Father     History  Substance Use Topics  . Smoking status: Never Smoker   . Smokeless tobacco: Not on file  . Alcohol Use: No    Review of Systems   As noted in HPI  Filed Vitals:   01/23/15 1222  BP: 114/78  Pulse: 72  Temp: 98.1 F (36.7 C)  Resp: 16    Physical Exam  Physical Exam  Constitutional: No distress.  HENT:  Dental cavities  Cardiovascular: Normal rate and regular rhythm.   Pulmonary/Chest: Breath sounds normal. No respiratory distress. She has no  wheezes. She has no rales.  Musculoskeletal:  Left foot big  toe bunion , minimal tenderness On the heel, 2+ dorsalis pedis pulse    CBC    Component Value Date/Time   WBC 12.7* 08/31/2013 1646   RBC 4.52 08/31/2013 1646   HGB 13.0 08/31/2013 1646   HCT 39.3 08/31/2013 1646   PLT 365 08/31/2013 1646   MCV 86.9 08/31/2013 1646    CMP     Component Value Date/Time   NA 137 01/30/2014 1145   K 4.8 01/30/2014 1145   CL 104 01/30/2014 1145   CO2 26 01/30/2014 1145   GLUCOSE 93 01/30/2014 1145   BUN 15 01/30/2014 1145   CREATININE 0.51 01/30/2014 1145   CALCIUM 9.5 01/30/2014 1145   PROT 6.9 08/31/2013 1655   ALBUMIN 4.3 08/31/2013 1655   AST 14 08/31/2013 1655   ALT 9 08/31/2013 1655   ALKPHOS 73 08/31/2013 1655   BILITOT 0.3 08/31/2013 1655   GFRNONAA >89 08/31/2013 1655   GFRAA >89 08/31/2013 1655    No results found for: CHOL  No components found for: HGA1C  Lab Results  Component Value Date/Time   AST 14 08/31/2013 04:55 PM    Assessment and Plan  Dental  cavities Patient is referred to dentist  Heel pain, left Likely plantar fasciitis, advise patient to wear comfortable shoes, NSAIDs when necessary, referred to podiatry.   No Follow-up on file.   This note has been created with Surveyor, quantity. Any transcriptional errors are unintentional.    Lorayne Marek, MD

## 2015-01-23 NOTE — Progress Notes (Signed)
Patient here with husband Complains of having some dental pain and will need a referral to dentist Also complains of pain to the heel of her left foot

## 2015-01-23 NOTE — Progress Notes (Deleted)
MRN: [id number] Name: Leah Cain  Sex: female Age: 44 y.o. DOB: [birth year]  Allergies: Review of patient's allergies indicates no known allergies.  Chief Complaint  Patient presents with  . Foot Pain    HPI: Patient is 44 y.o. female who   Past Medical History  Diagnosis Date  . Eczema     Past Surgical History  Procedure Laterality Date  . Cervical prolapse  2006      Medication List       This list is accurate as of: 01/23/15  1:53 PM.  Always use your most recent med list.               baclofen 10 MG tablet  Commonly known as:  LIORESAL  Take 1 tablet (10 mg total) by mouth at bedtime.     cetirizine 10 MG tablet  Commonly known as:  ZYRTEC  Take 1 tablet (10 mg total) by mouth daily.     cyclobenzaprine 10 MG tablet  Commonly known as:  FLEXERIL  Take 1 tablet (10 mg total) by mouth at bedtime.     diclofenac 75 MG EC tablet  Commonly known as:  VOLTAREN  Take 1 tablet (75 mg total) by mouth 2 (two) times daily.     fluticasone 50 MCG/ACT nasal spray  Commonly known as:  FLONASE  Place 2 sprays into both nostrils daily.     HYDROcodone-acetaminophen 5-325 MG per tablet  Commonly known as:  NORCO  Take 1-2 tablets by mouth every 4 (four) hours as needed.     naproxen sodium 550 MG tablet  Commonly known as:  ANAPROX DS  Take 1 tablet (550 mg total) by mouth 2 (two) times daily with a meal.     pantoprazole 40 MG tablet  Commonly known as:  PROTONIX  Take 1 tablet (40 mg total) by mouth 2 (two) times daily.     traMADol 50 MG tablet  Commonly known as:  ULTRAM  Take 1 tablet (50 mg total) by mouth every 12 (twelve) hours as needed.     triamcinolone 0.025 % ointment  Commonly known as:  KENALOG  Apply 1 application topically 2 (two) times daily. Both hands        No orders of the defined types were placed in this encounter.     There is no immunization history on file for this patient.  Family History  Problem Relation Age of  Onset  . Stroke Father     History  Substance Use Topics  . Smoking status: Never Smoker   . Smokeless tobacco: Not on file  . Alcohol Use: No    Review of Systems   As noted in HPI  Filed Vitals:   01/23/15 1222  BP: 114/78  Pulse: 72  Temp: 98.1 F (36.7 C)  Resp: 16    Physical Exam  Physical Exam  CBC    Component Value Date/Time   WBC 12.7* 08/31/2013 1646   RBC 4.52 08/31/2013 1646   HGB 13.0 08/31/2013 1646   HCT 39.3 08/31/2013 1646   PLT 365 08/31/2013 1646   MCV 86.9 08/31/2013 1646    CMP     Component Value Date/Time   NA 137 01/30/2014 1145   K 4.8 01/30/2014 1145   CL 104 01/30/2014 1145   CO2 26 01/30/2014 1145   GLUCOSE 93 01/30/2014 1145   BUN 15 01/30/2014 1145   CREATININE 0.51 01/30/2014 1145   CALCIUM 9.5 01/30/2014 1145  PROT 6.9 08/31/2013 1655   ALBUMIN 4.3 08/31/2013 1655   AST 14 08/31/2013 1655   ALT 9 08/31/2013 1655   ALKPHOS 73 08/31/2013 1655   BILITOT 0.3 08/31/2013 1655   GFRNONAA >89 08/31/2013 1655   GFRAA >89 08/31/2013 1655    No results found for: CHOL  No components found for: HGA1C  Lab Results  Component Value Date/Time   AST 14 08/31/2013 04:55 PM    Assessment and Plan  No diagnosis found.   Health Maintenance -Colonoscopy: -Pap Smear: -Mammogram: -Vaccinations:  -TdAP  -PNA (PPSV23) (one dose after 65) (or one dose before 65 if chronic conditions)  -Zoster (1 dose after 60 yrs)  -Influenza  No Follow-up on file.   This note has been created with Surveyor, quantity. Any transcriptional errors are unintentional.    Lorayne Marek, MD

## 2015-01-29 ENCOUNTER — Other Ambulatory Visit: Payer: Self-pay

## 2015-01-29 ENCOUNTER — Telehealth: Payer: Self-pay

## 2015-01-29 DIAGNOSIS — M545 Low back pain, unspecified: Secondary | ICD-10-CM

## 2015-01-29 DIAGNOSIS — G8929 Other chronic pain: Secondary | ICD-10-CM

## 2015-01-29 MED ORDER — TRAMADOL HCL 50 MG PO TABS: 50.0000 mg | ORAL_TABLET | Freq: Two times a day (BID) | ORAL | Status: DC | PRN

## 2015-01-29 NOTE — Telephone Encounter (Signed)
Patient husband called requesting a refill on his wife's tramadol Prescription printed and is at the front desk

## 2015-02-09 ENCOUNTER — Ambulatory Visit: Payer: No Typology Code available for payment source

## 2015-02-09 ENCOUNTER — Ambulatory Visit (INDEPENDENT_AMBULATORY_CARE_PROVIDER_SITE_OTHER): Payer: No Typology Code available for payment source | Admitting: Podiatrist

## 2015-02-09 DIAGNOSIS — R52 Pain, unspecified: Secondary | ICD-10-CM

## 2015-02-09 MED ORDER — MELOXICAM 15 MG PO TABS: 15.0000 mg | ORAL_TABLET | Freq: Every day | ORAL | Status: DC

## 2015-02-09 MED ORDER — HYDROCODONE-ACETAMINOPHEN 5-325 MG PO TABS: 1.0000 | ORAL_TABLET | Freq: Four times a day (QID) | ORAL | Status: DC | PRN

## 2015-02-09 NOTE — Progress Notes (Signed)
   Subjective:    Patient ID: Unnamed Hino, female    DOB: 11-Apr-1971, 44 y.o.   MRN: [id number]  HPI  PT HUSBAND STATED LT FOOT BOTTOM OF THE HEEL AND ANKLE BEEN SWOLLEN FOR 2 MONTHS. FOOT IS BEEN THE SAME BUT FIRST STEP IN THE MORNING IS WORSE. TRIED RX. NAPROXEN, ADVIL, AND TRAMADOL BUT NO HELP  Review of Systems  Constitutional: Positive for unexpected weight change.  HENT: Positive for sinus pressure.   Respiratory: Positive for chest tightness.   Cardiovascular: Positive for leg swelling.  Gastrointestinal: Positive for constipation.  Allergic/Immunologic: Positive for environmental allergies.  Neurological: Positive for tremors and numbness.  Hematological: Bruises/bleeds easily.       Objective:   Physical Exam Vascular status is intact and unchanged with palpable pedal pulses present and neurological sensation intact. Bunion deformity is also present. At the insertion of the plantar fascia is noted. Compensatory pain along the lateral left ankle is also noted. She presents today in a pair of Thin shoes with minimal support.    Assessment & Plan:  Plantar fasciitis left, lateral ankle pain from compensation  Plan:  Injected the plantar fascia left-  Her husband was insistent that she is unable to sleep and needs pain medication for the discomfort.  I did agree to a one time prescription of hydrocodone/apap.  No more pain medication will be rx'd.  She was given stretches and recommendations for shoegear.

## 2015-02-09 NOTE — Patient Instructions (Signed)

## 2015-02-26 ENCOUNTER — Other Ambulatory Visit: Payer: Self-pay

## 2015-02-26 MED ORDER — TRAMADOL HCL 50 MG PO TABS: 50.0000 mg | ORAL_TABLET | Freq: Two times a day (BID) | ORAL | Status: DC | PRN

## 2015-03-28 ENCOUNTER — Telehealth: Payer: Self-pay

## 2015-03-28 ENCOUNTER — Other Ambulatory Visit: Payer: Self-pay

## 2015-03-28 MED ORDER — TRAMADOL HCL 50 MG PO TABS: 50.0000 mg | ORAL_TABLET | Freq: Two times a day (BID) | ORAL | Status: DC | PRN

## 2015-03-28 NOTE — Telephone Encounter (Signed)
Patient called requesting a refill on her tramadol Prescription printed and available at the front desk

## 2015-04-26 ENCOUNTER — Telehealth: Payer: Self-pay

## 2015-04-26 MED ORDER — TRAMADOL HCL 50 MG PO TABS: 50.0000 mg | ORAL_TABLET | Freq: Two times a day (BID) | ORAL | Status: DC | PRN

## 2015-04-26 NOTE — Telephone Encounter (Signed)
Patient's husband called requesting a refill on his wifes tramadol Prescription printed and is at the front desk

## 2015-05-25 ENCOUNTER — Telehealth: Payer: Self-pay

## 2015-05-25 MED ORDER — TRAMADOL HCL 50 MG PO TABS: 50.0000 mg | ORAL_TABLET | Freq: Two times a day (BID) | ORAL | Status: DC | PRN

## 2015-05-25 NOTE — Telephone Encounter (Signed)
Husband called requesting a refill on his wifes tramadol Prescription printed and he is aware he can pick it up at the front desk

## 2015-06-21 ENCOUNTER — Telehealth: Payer: Self-pay

## 2015-06-21 MED ORDER — TRAMADOL HCL 50 MG PO TABS: 50.0000 mg | ORAL_TABLET | Freq: Two times a day (BID) | ORAL | Status: DC | PRN

## 2015-06-21 NOTE — Telephone Encounter (Signed)
Patient's husband called requesting a refill on her tramadol Prescription printed and available at the front desk

## 2015-06-21 NOTE — Telephone Encounter (Signed)
Patient picked up form from front desk

## 2015-07-18 ENCOUNTER — Telehealth: Payer: Self-pay

## 2015-07-18 NOTE — Telephone Encounter (Signed)
Patient's husband called requesting a refill on her Tramadol Can you refill this?

## 2015-07-19 MED ORDER — TRAMADOL HCL 50 MG PO TABS: 50.0000 mg | ORAL_TABLET | Freq: Two times a day (BID) | ORAL | Status: DC | PRN

## 2015-07-19 NOTE — Telephone Encounter (Signed)
May refill 30 tablets only

## 2015-07-19 NOTE — Telephone Encounter (Signed)
Spoke with patient and he is aware his wifes tramadol has been refilled and that he Can pick it up at the front desk

## 2015-08-20 ENCOUNTER — Telehealth: Payer: Self-pay

## 2015-08-20 ENCOUNTER — Other Ambulatory Visit: Payer: Self-pay

## 2015-08-20 MED ORDER — TRAMADOL HCL 50 MG PO TABS: 50.0000 mg | ORAL_TABLET | Freq: Two times a day (BID) | ORAL | Status: DC | PRN

## 2015-08-20 NOTE — Telephone Encounter (Signed)
Due to language barrier patient's husband called requesting a refill on his wife's Tramadol RX printed and is at the front desk

## 2015-09-03 ENCOUNTER — Ambulatory Visit: Payer: Self-pay

## 2015-09-17 ENCOUNTER — Telehealth: Payer: Self-pay

## 2015-09-17 MED ORDER — TRAMADOL HCL 50 MG PO TABS: 50.0000 mg | ORAL_TABLET | Freq: Two times a day (BID) | ORAL | Status: DC | PRN

## 2015-09-17 NOTE — Telephone Encounter (Signed)
Patient's husband called requesting a refill on her tramadol Prescription printed and is at the front desk

## 2015-09-24 ENCOUNTER — Ambulatory Visit: Payer: Self-pay | Attending: Family Medicine

## 2015-10-08 IMAGING — US US ABDOMEN COMPLETE
1 series · 14 of 25 positions shown · non-contrast
Comparison: None.

CLINICAL DATA: Postprandial abdominal discomfort.

EXAM:
ULTRASOUND ABDOMEN COMPLETE

[Series 1: us abdomen complete · 0.25mm/px · 14 of 80 slices shown]
[im 1/80]
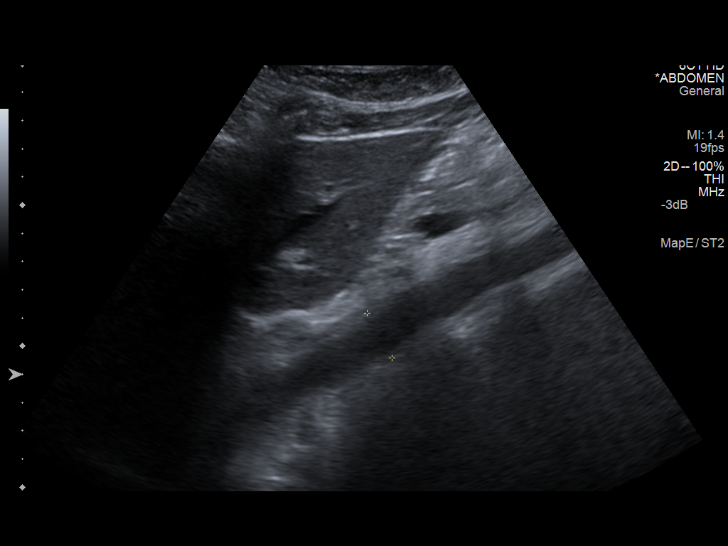
[im 7/80]
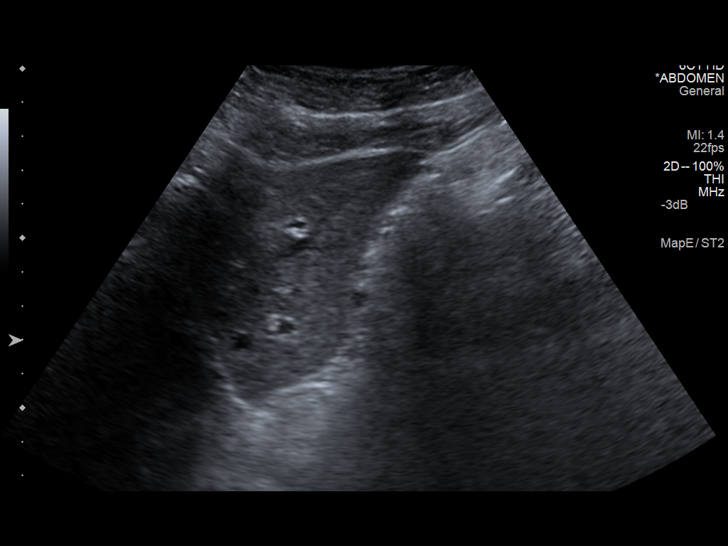
[im 14/80]
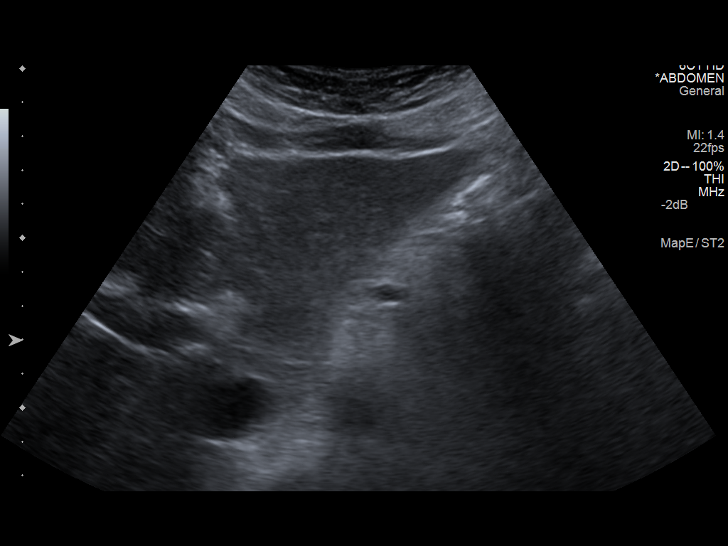
[im 20/80]
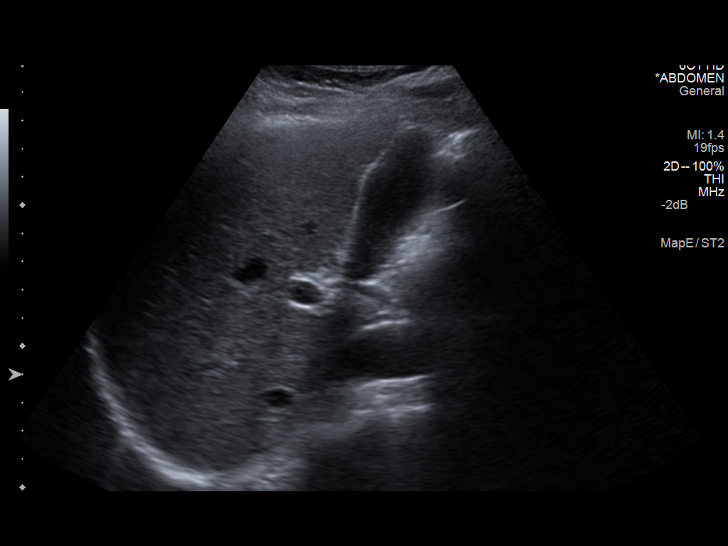
[im 27/80]
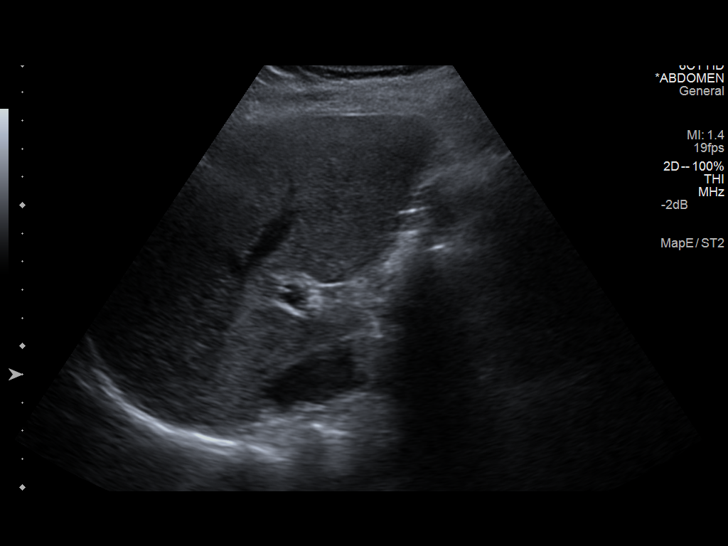
[im 30/80]
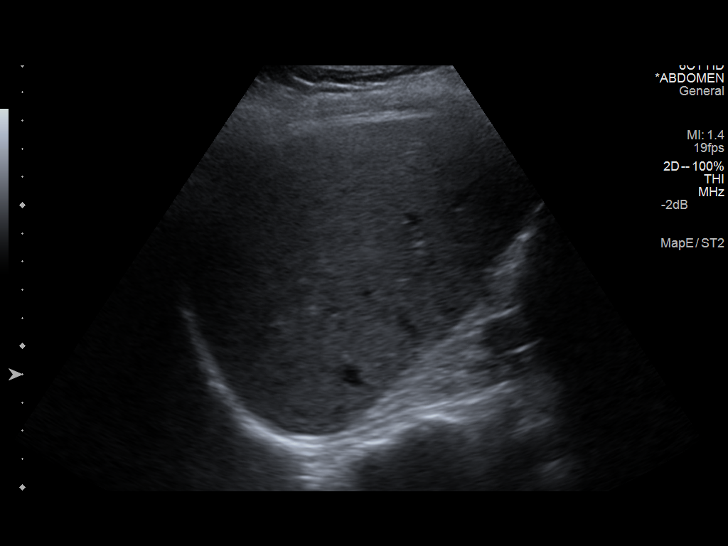
[im 37/80]
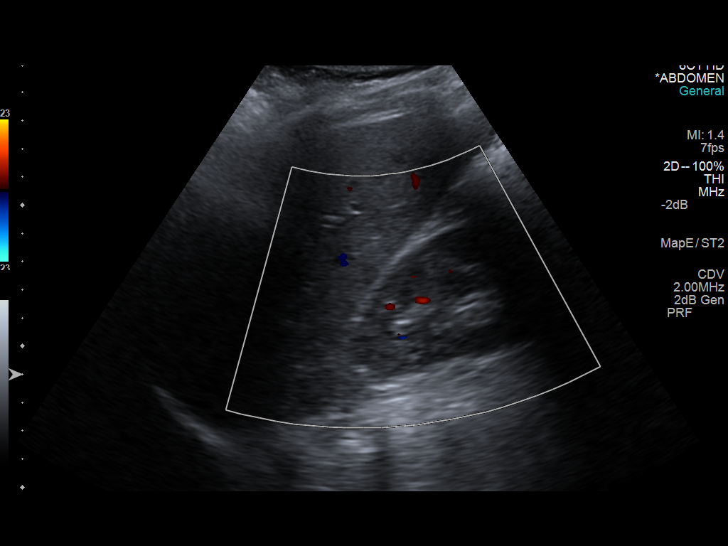
[im 43/80]
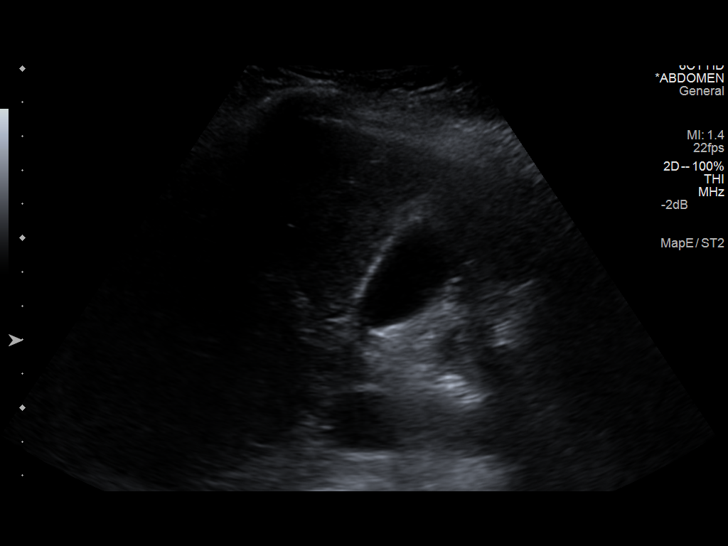
[im 50/80]
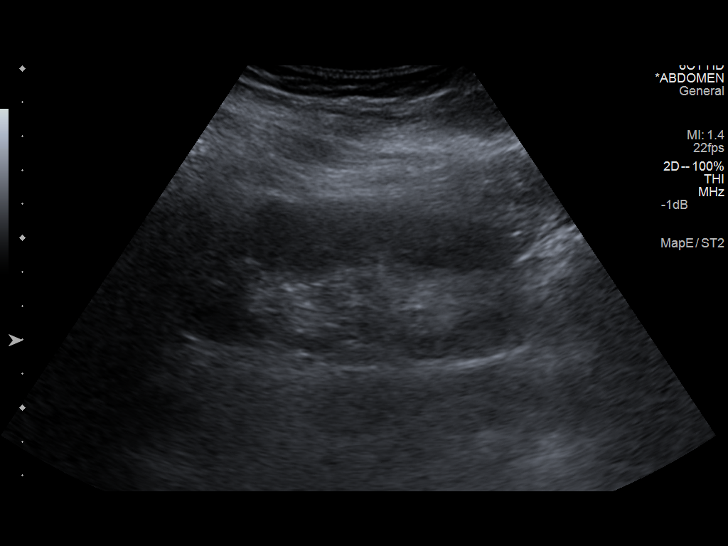
[im 53/80]
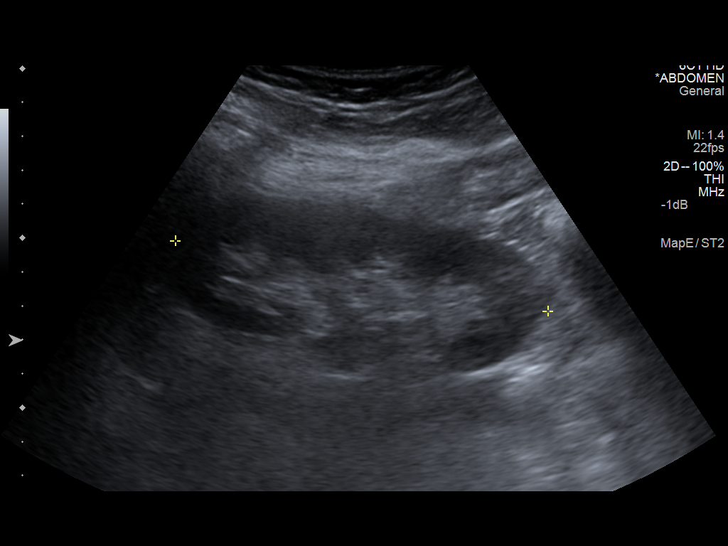
[im 60/80]
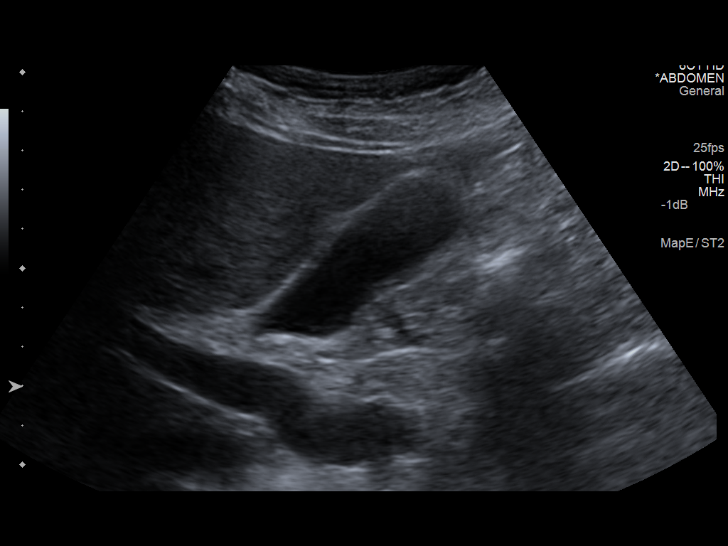
[im 66/80]
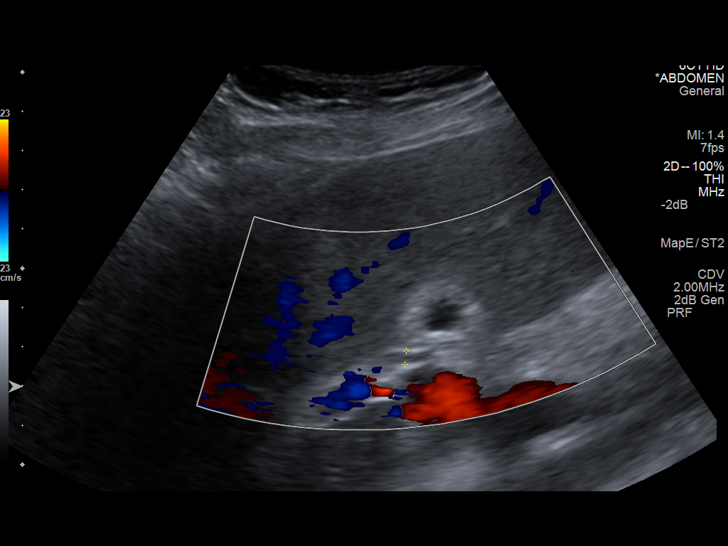
[im 73/80]
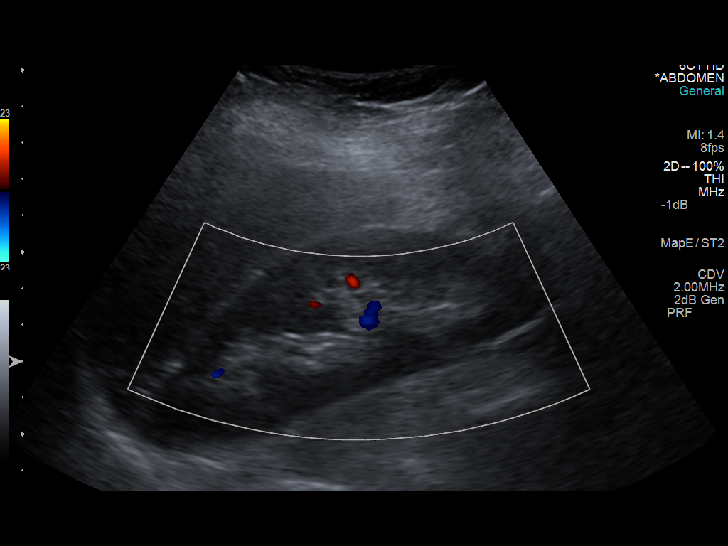
[im 80/80]
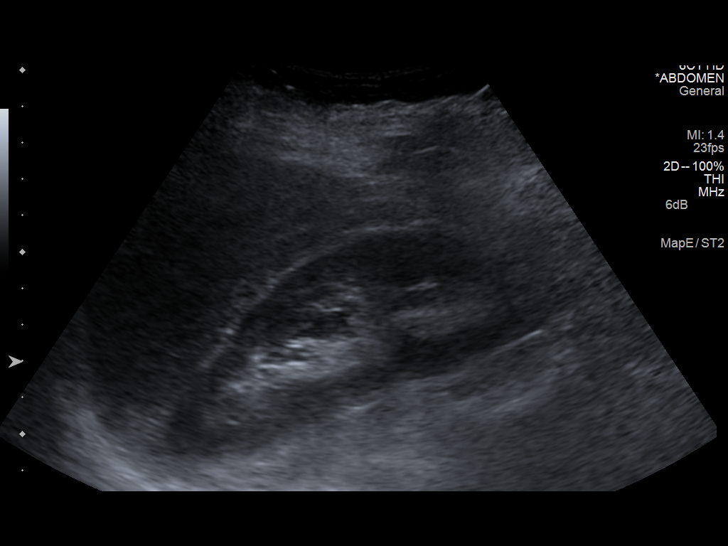

[14 of 25 positions shown; findings below may reference images not displayed]

FINDINGS: Gallbladder:

The gallbladder is adequately distended with no evidence of stones,
wall thickening, or pericholecystic fluid. There is no positive
sonographic Murphy's sign.

Common bile duct:

Diameter: 3.5 mm.

Liver:

No focal lesion identified. Within normal limits in parenchymal
echogenicity.

IVC:

No abnormality visualized.

Pancreas:

Visualized portion unremarkable.

Spleen:

Size and appearance within normal limits.

Right Kidney:

Length: 12.2 cm.. Echogenicity within normal limits. No mass or
hydronephrosis visualized.

Left Kidney:

Length: 11.2 cm.. Echogenicity within normal limits. No mass or
hydronephrosis visualized.

Abdominal aorta:

No aneurysm visualized.

Other findings:

There is no ascites demonstrated.
IMPRESSION: Normal abdominal ultrasound examination.

## 2015-10-15 ENCOUNTER — Emergency Department (HOSPITAL_COMMUNITY)
Admission: EM | Admit: 2015-10-15 | Discharge: 2015-10-15 | Disposition: A | Discharge status: Home / Self Care | Payer: Self-pay | Attending: Emergency Medicine | Admitting: Emergency Medicine

## 2015-10-15 ENCOUNTER — Emergency Department (HOSPITAL_COMMUNITY): Admission: EM | Admit: 2015-10-15 | Discharge: 2015-10-15 | Discharge status: Absent without leave | Payer: Self-pay

## 2015-10-15 ENCOUNTER — Encounter (HOSPITAL_COMMUNITY): Payer: Self-pay | Admitting: Emergency Medicine

## 2015-10-15 DIAGNOSIS — M25512 Pain in left shoulder: Secondary | ICD-10-CM | POA: Insufficient documentation

## 2015-10-15 DIAGNOSIS — Z872 Personal history of diseases of the skin and subcutaneous tissue: Secondary | ICD-10-CM | POA: Insufficient documentation

## 2015-10-15 DIAGNOSIS — M79601 Pain in right arm: Secondary | ICD-10-CM

## 2015-10-15 DIAGNOSIS — Z791 Long term (current) use of non-steroidal anti-inflammatories (NSAID): Secondary | ICD-10-CM | POA: Insufficient documentation

## 2015-10-15 DIAGNOSIS — M25511 Pain in right shoulder: Secondary | ICD-10-CM | POA: Insufficient documentation

## 2015-10-15 DIAGNOSIS — M79602 Pain in left arm: Secondary | ICD-10-CM

## 2015-10-15 MED ORDER — OXYCODONE-ACETAMINOPHEN 5-325 MG PO TABS
2.0000 | ORAL_TABLET | Freq: Once | ORAL | Status: AC
  Administered 2015-10-15: 2 via ORAL
  Filled 2015-10-15: qty 2

## 2015-10-15 MED ORDER — PREDNISONE 20 MG PO TABS
60.0000 mg | ORAL_TABLET | Freq: Once | ORAL | Status: AC
  Administered 2015-10-15: 60 mg via ORAL
  Filled 2015-10-15: qty 3

## 2015-10-15 MED ORDER — IBUPROFEN 800 MG PO TABS
800.0000 mg | ORAL_TABLET | Freq: Three times a day (TID) | ORAL | Status: DC
Start: 2015-10-15 — End: 2015-10-26

## 2015-10-15 MED ORDER — KETOROLAC TROMETHAMINE 60 MG/2ML IM SOLN
60.0000 mg | Freq: Once | INTRAMUSCULAR | Status: AC
  Administered 2015-10-15: 60 mg via INTRAMUSCULAR
  Filled 2015-10-15: qty 2

## 2015-10-15 MED ORDER — OXYCODONE-ACETAMINOPHEN 5-325 MG PO TABS: 1.0000 | ORAL_TABLET | ORAL | Status: DC | PRN

## 2015-10-15 NOTE — ED Notes (Signed)
Pt states that she has been having pain in bilateral shoulders radiating to elbows, pain in feet, and pain in low back.  All atraumatic.  States it is worse when she gets up in the morning.  Has been taking aleve, tylenol, ibuprofen.

## 2015-10-15 NOTE — ED Provider Notes (Signed)
CSN: [id number]     Arrival date & time 10/15/15  1207 History  By signing my name below, I, Irene Pap, attest that this documentation has been prepared under the direction and in the presence of Shawn Joy, PA-C. Electronically Signed: Irene Pap, ED Scribe. 10/15/2015. 1:30 PM.   Chief Complaint  Patient presents with  . Arm Pain  . Shoulder Pain   The history is provided by the patient. No language interpreter was used.  HPI Comments: Leah Cain is a 44 y.o. female who presents to the Emergency Department complaining of gradually worsening, severe, stabbing, bilateral shoulder pain that radiates to the elbows, bilateral feet pain and lower back pain onset one month ago and progressively worsened in the last 3 days. Pt reports worsening pain when she wakes up in the morning, tingling in bilateral fingertips that worsens at night, and rates the pain 10/10. Currently, the pt reports the pain is worse in the left arm than the right. She reports taking tramadol, Tylenol, Aleve and ibuprofen to no relief. She denies injury to the affected areas, fall, fever, chills, neck pain, numbness, or weakness.   Past Medical History  Diagnosis Date  . Eczema    Past Surgical History  Procedure Laterality Date  . Cervical prolapse  2006   Family History  Problem Relation Age of Onset  . Stroke Father    Social History  Substance Use Topics  . Smoking status: Never Smoker   . Smokeless tobacco: None  . Alcohol Use: No   OB History    No data available     Review of Systems  Constitutional: Negative for fever and chills.  Respiratory: Negative for shortness of breath.   Cardiovascular: Negative for chest pain.  Musculoskeletal: Positive for arthralgias (Bilateral upper arm pain). Negative for neck pain.  Neurological: Negative for dizziness, weakness, light-headedness, numbness and headaches.  All other systems reviewed and are negative.  Allergies  Review of patient's allergies  indicates no known allergies.  Home Medications   Prior to Admission medications   Medication Sig Start Date End Date Taking? Authorizing Provider  HYDROcodone-acetaminophen (NORCO) 5-325 MG per tablet Take 1 tablet by mouth every 6 (six) hours as needed. 02/09/15   Bronson Ing, DPM  ibuprofen (ADVIL,MOTRIN) 800 MG tablet Take 1 tablet (800 mg total) by mouth 3 (three) times daily. 10/15/15   Shawn C Joy, PA-C  meloxicam (MOBIC) 15 MG tablet Take 1 tablet (15 mg total) by mouth daily. 02/09/15   Bronson Ing, DPM  oxyCODONE-acetaminophen (PERCOCET/ROXICET) 5-325 MG tablet Take 1-2 tablets by mouth every 4 (four) hours as needed for severe pain. 10/15/15   Shawn C Joy, PA-C  traMADol (ULTRAM) 50 MG tablet Take 1 tablet (50 mg total) by mouth every 12 (twelve) hours as needed. 09/17/15   Tresa Garter, MD   BP 122/80 mmHg  Pulse 79  Temp(Src) 98.2 F (36.8 C) (Oral)  Resp 18  SpO2 100% Physical Exam  Constitutional: She is oriented to person, place, and time. She appears well-developed and well-nourished.  HENT:  Head: Normocephalic and atraumatic.  Eyes: EOM are normal.  Neck: Normal range of motion. Neck supple.  Cardiovascular: Normal rate.   Pulmonary/Chest: Effort normal.  Musculoskeletal: Normal range of motion.  Pain with ROM of bilateral upper extremities, but ROM is intact. Full ROM in all extremities and spine. No paraspinal tenderness.   Neurological: She is alert and oriented to person, place, and time.  No sensory deficits.  Strength 5/5 in all extremities. No gait disturbance. Cranial nerves III-XII grossly intact. No facial droop.  Skin: Skin is warm and dry.  Psychiatric: She has a normal mood and affect. Her behavior is normal.  Nursing note and vitals reviewed.   ED Course  Procedures (including critical care time) DIAGNOSTIC STUDIES: Oxygen Saturation is 100% on RA, normal by my interpretation.    COORDINATION OF CARE: 1:27 PM-Discussed  treatment plan which includes referral to orthopedics and pain medication with pt at bedside and pt agreed to plan.    Labs Review Labs Reviewed - No data to display  Imaging Review No results found. I have personally reviewed and evaluated these images and lab results as part of my medical decision-making.   EKG Interpretation None      MDM   Final diagnoses:  Bilateral arm pain    Leah Cain presents with bilateral upper arm pain for the past month.  Patient's presentation is consistent with either musculoskeletal or radiculopathy pain. Patient has no neuro functional deficits to indicate an emergent condition. Patient indicated that she really was here to get pain medicine. Patient states that she feels much better following the care here in the ED. No indications at this time for plain film imaging or emergent MRI. Patient was given instructions for home care as well as return precautions. Patient and patient's husband at bedside voice understanding of these instructions, accepts the plan, and is comfortable with discharge. I personally performed the services described in this documentation, which was scribed in my presence. The recorded information has been reviewed and is accurate.    Lorayne Bender, PA-C 10/15/15 1423  Milton Ferguson, MD 10/18/15 1029

## 2015-10-15 NOTE — Discharge Instructions (Signed)
You have been seen today for arm pain. Follow-up with orthopedics as soon as possible on this issue. Follow up with PCP as needed. Return to ED should symptoms worsen.

## 2015-10-15 NOTE — Progress Notes (Signed)
Pt c/o bilateral deltoid pain and pain in both hands worse at night. Pt stated the hand pain is worse at night. Pt is only able to lift her arms half way up and then experiences pain. Family remains at the bedside with the pt. Both hands do appears slightly swollen and slight reddness. Per husband the pt has had some injections.

## 2015-10-16 ENCOUNTER — Telehealth: Payer: Self-pay

## 2015-10-16 ENCOUNTER — Telehealth: Payer: Self-pay | Admitting: Family Medicine

## 2015-10-16 DIAGNOSIS — G8929 Other chronic pain: Secondary | ICD-10-CM

## 2015-10-16 DIAGNOSIS — M545 Low back pain: Principal | ICD-10-CM

## 2015-10-16 MED ORDER — TRAMADOL HCL 50 MG PO TABS: 50.0000 mg | ORAL_TABLET | Freq: Two times a day (BID) | ORAL | Status: DC | PRN

## 2015-10-16 NOTE — Telephone Encounter (Signed)
Tramadol refilled Please facilitate f/u appt with new PCP as well. She can see anyone available

## 2015-10-16 NOTE — Telephone Encounter (Signed)
Pt's husband came in requesting to speak with Leah Cain.  She was given medication in the hospital and he says she has been throwing up and sleeping all day so she has stopped taking the medication. Pt's husband is requesting new medication be filled today but I did inform him that it usually takes up to 48 hours. Pt's husband is very persistent please call

## 2015-10-16 NOTE — Telephone Encounter (Signed)
Due to a language barrier patient's husband calls Calling to request a refill on her tramadol Can we refill this ? Thanks

## 2015-10-16 NOTE — Addendum Note (Signed)
Addended by: Boykin Nearing on: 10/16/2015 04:33 PM   Modules accepted: Orders, Medications

## 2015-10-26 ENCOUNTER — Ambulatory Visit: Payer: Self-pay | Attending: Internal Medicine | Admitting: Internal Medicine

## 2015-10-26 ENCOUNTER — Encounter: Payer: Self-pay | Admitting: Internal Medicine

## 2015-10-26 VITALS — BP 121/82 | HR 69 | Temp 98.8°F | Resp 16 | Ht 63.0 in | Wt 181.6 lb

## 2015-10-26 DIAGNOSIS — M79601 Pain in right arm: Secondary | ICD-10-CM | POA: Insufficient documentation

## 2015-10-26 DIAGNOSIS — Z79899 Other long term (current) drug therapy: Secondary | ICD-10-CM | POA: Insufficient documentation

## 2015-10-26 DIAGNOSIS — M722 Plantar fascial fibromatosis: Secondary | ICD-10-CM | POA: Insufficient documentation

## 2015-10-26 DIAGNOSIS — M79602 Pain in left arm: Secondary | ICD-10-CM | POA: Insufficient documentation

## 2015-10-26 MED ORDER — ACETAMINOPHEN-CODEINE #3 300-30 MG PO TABS: 1.0000 | ORAL_TABLET | Freq: Four times a day (QID) | ORAL | Status: DC | PRN

## 2015-10-26 MED FILL — ACETAMINOPHEN/COD #3 TABLET: 300-30 | 15 days supply | Qty: 60 | Fill #0

## 2015-10-26 NOTE — Progress Notes (Signed)
Patient ID: Leah Cain, female   DOB: 14-Oct-1971, 45 y.o.   MRN: [id number]  CC: Arm pain, heel pain   HPI: Leah Cain is a 45 y.o. female here today for a follow up visit.  Patient has past medical history of chronic back pain. Patient was seen in the ER last week for complaints of bilateral arm pain and heel pain. Patient reports that arm pain has gradually worsened over the past couple of weeks and is now a sore throbbing pain that is worse at bedtime. She has tried Ibuprofen, Meloxicam, Aleve, and Tramadol for pain all without relief. States that ibuprofen and Aleve cause upset stomach so she stopped taking the medication. She denies any recent injuries. Patient reports that she was diagnosed with plantar fascitis of her left heel 4 months ago and received injections in the heel by a podiatrist. She now states that the pain has returned and is worse upon steeping out of bed in the morrning. She has not completed any of the foot exercises. She would like to go back to podiatry.   No Known Allergies Past Medical History  Diagnosis Date  . Eczema    Current Outpatient Prescriptions on File Prior to Visit  Medication Sig Dispense Refill  . meloxicam (MOBIC) 15 MG tablet Take 1 tablet (15 mg total) by mouth daily. 30 tablet 2  . traMADol (ULTRAM) 50 MG tablet Take 1 tablet (50 mg total) by mouth every 12 (twelve) hours as needed. 60 tablet 0  . ibuprofen (ADVIL,MOTRIN) 800 MG tablet Take 1 tablet (800 mg total) by mouth 3 (three) times daily. (Patient not taking: Reported on 10/26/2015) 21 tablet 0  . oxyCODONE-acetaminophen (PERCOCET/ROXICET) 5-325 MG tablet Take 1-2 tablets by mouth every 4 (four) hours as needed for severe pain. 15 tablet 0   No current facility-administered medications on file prior to visit.   Family History  Problem Relation Age of Onset  . Stroke Father    Social History   Social History  . Marital Status: Married    Spouse Name: N/A  . Number of Children: N/A  .  Years of Education: N/A   Occupational History  . Not on file.   Social History Main Topics  . Smoking status: Never Smoker   . Smokeless tobacco: Not on file  . Alcohol Use: No  . Drug Use: No  . Sexual Activity: Not on file   Other Topics Concern  . Not on file   Social History Narrative    Review of Systems: Other than what is stated in HPI, all other systems are negative.   Objective:   Filed Vitals:   10/26/15 1419  BP: 121/82  Pulse: 69  Temp: 98.8 F (37.1 C)  Resp: 16    Physical Exam  Cardiovascular: Normal rate, regular rhythm and normal heart sounds.   Pulmonary/Chest: Effort normal and breath sounds normal.  Musculoskeletal: Normal range of motion. She exhibits no tenderness.  Skin: Skin is warm and dry.     Lab Results  Component Value Date   WBC 12.7* 08/31/2013   HGB 13.0 08/31/2013   HCT 39.3 08/31/2013   MCV 86.9 08/31/2013   PLT 365 08/31/2013   Lab Results  Component Value Date   CREATININE 0.51 01/30/2014   BUN 15 01/30/2014   NA 137 01/30/2014   K 4.8 01/30/2014   CL 104 01/30/2014   CO2 26 01/30/2014    Lab Results  Component Value Date   HGBA1C 5.1 08/31/2013  Lipid Panel  No results found for: CHOL, TRIG, HDL, CHOLHDL, VLDL, LDLCALC     Assessment and plan:   Leah Cain was seen today for arm pain.  Diagnoses and all orders for this visit:  Plantar fasciitis, left -     Ambulatory referral to Podiatry I have demonstrated several foot exercises to patient and husband that will help stretch the fascia.   Bilateral arm pain -     Ambulatory referral to Orthopedic Surgery -     acetaminophen-codeine (TYLENOL #3) 300-30 MG tablet; Take 1 tablet by mouth every 6 (six) hours as needed for moderate pain. Unsure of etiology of pain. Will defer to Ortho  Return if symptoms worsen or fail to improve.       Lance Bosch, [facility] [phone] 10/26/2015, 2:46 PM

## 2015-10-26 NOTE — Progress Notes (Signed)
Patient complains of bilateral upper arm pain that is ongoing States her tramadol is not really helping  Also her heel pain to her left foot has returned and will need a referral to podiatry

## 2015-11-07 ENCOUNTER — Ambulatory Visit
Admission: RE | Admit: 2015-11-07 | Discharge: 2015-11-07 | Disposition: A | Discharge status: Home / Self Care | Payer: Self-pay | Source: Ambulatory Visit | Attending: Family Medicine | Admitting: Family Medicine

## 2015-11-07 ENCOUNTER — Encounter: Payer: Self-pay | Admitting: Family Medicine

## 2015-11-07 ENCOUNTER — Ambulatory Visit (INDEPENDENT_AMBULATORY_CARE_PROVIDER_SITE_OTHER): Payer: Self-pay | Admitting: Family Medicine

## 2015-11-07 VITALS — BP 119/73 | Ht 67.0 in | Wt 170.0 lb

## 2015-11-07 DIAGNOSIS — M79601 Pain in right arm: Secondary | ICD-10-CM

## 2015-11-07 DIAGNOSIS — M79602 Pain in left arm: Secondary | ICD-10-CM

## 2015-11-07 DIAGNOSIS — M5010 Cervical disc disorder with radiculopathy, unspecified cervical region: Secondary | ICD-10-CM

## 2015-11-07 MED ORDER — TRAMADOL HCL 50 MG PO TABS: 50.0000 mg | ORAL_TABLET | Freq: Four times a day (QID) | ORAL | Status: DC | PRN

## 2015-11-07 MED ORDER — PREDNISONE 10 MG PO TABS: ORAL_TABLET | ORAL | Status: DC

## 2015-11-07 NOTE — Assessment & Plan Note (Signed)
I would be highly surprised if she does not have cervical neck/disc pathology. In the differential would consider bilateral shoulder impingement, [place]-Turner syndrome. - 2 view c spine films - Dose Pack 12 day - Tramadol for pain - F/U after x-rays.  If still bothersome, consider MRI and PT referral vs possible neurosurgery for disc pathology.

## 2015-11-07 NOTE — Progress Notes (Signed)
  Leah Cain - 45 y.o. female MRN [id number]  Date of birth: 1971-07-06 Leah Cain is a 45 y.o. female who presents today for bilateral cervical radiculopathy.  Bilateral cervical radiculopathy, initial visit-patient has been having ongoing bilateral paresthesias going down the arm into the hand for the past several weeks now. Pain is so severe that she did present to the emergency room and was given Tylenol 3 and naproxen. This is not touched her pain and she is starting to have worsening pain now. Positive monkey sign. Denies weakness in either arm. No acute injury or trauma. Right-hand dominant. No previous injury to her neck.  PMHx - Updated and reviewed.  Contributory factors include: Noncontributory PSHx - Updated and reviewed.  Contributory factors include:  Noncontributory FHx - Updated and reviewed.  Contributory factors include:  Noncontributory Medications - tramadol and a Tylenol 3 when necessary   ROS Per HPI.  12 point negative other than per HPI.   Exam:  Filed Vitals:   11/07/15 1443  BP: 119/73   Gen: NAD, AAO 3 Cardiorespiratory - Normal respiratory effort/rate.  RRR Skin: No rashes or erythema Extremities: No edema, pulses +2 bilateral upper and lower extremity  Neck: Equivocal spurling's B/L  Full neck range of motion Grip strength and sensation normal in bilateral hands Strength good C4 to T1 distribution No sensory change to C4 to T1 Reflexes normal

## 2015-11-22 ENCOUNTER — Other Ambulatory Visit: Payer: Self-pay

## 2015-11-22 ENCOUNTER — Telehealth: Payer: Self-pay

## 2015-11-22 DIAGNOSIS — M79601 Pain in right arm: Secondary | ICD-10-CM

## 2015-11-22 DIAGNOSIS — M79602 Pain in left arm: Principal | ICD-10-CM

## 2015-11-22 MED ORDER — ACETAMINOPHEN-CODEINE #3 300-30 MG PO TABS: 1.0000 | ORAL_TABLET | Freq: Four times a day (QID) | ORAL | Status: DC | PRN

## 2015-11-22 MED FILL — ACETAMINOPHEN/COD #3 TABLET: 300-30 | 15 days supply | Qty: 60 | Fill #0

## 2015-11-22 NOTE — Telephone Encounter (Signed)
Patient's husband calling for refill on tylenol #3 for his wife Can i refill this for her thanks

## 2015-11-22 NOTE — Telephone Encounter (Signed)
May refill 

## 2015-11-26 ENCOUNTER — Other Ambulatory Visit: Payer: Self-pay | Admitting: Podiatry

## 2015-11-26 ENCOUNTER — Ambulatory Visit: Payer: No Typology Code available for payment source | Attending: Podiatry

## 2015-11-26 MED ORDER — MELOXICAM 15 MG PO TABS: 15.0000 mg | ORAL_TABLET | Freq: Every day | ORAL | Status: DC

## 2015-12-17 ENCOUNTER — Telehealth: Payer: Self-pay

## 2015-12-17 ENCOUNTER — Other Ambulatory Visit: Payer: Self-pay

## 2015-12-17 MED ORDER — TRAMADOL HCL 50 MG PO TABS: 50.0000 mg | ORAL_TABLET | Freq: Four times a day (QID) | ORAL | Status: DC | PRN

## 2015-12-17 NOTE — Telephone Encounter (Signed)
May refill 

## 2015-12-17 NOTE — Telephone Encounter (Signed)
Spoke with patient's husband due to language barrier He is aware her RX for tramadol is ready for pick up

## 2015-12-17 NOTE — Telephone Encounter (Signed)
Patient's husband called requesting a refill on Her tramadol

## 2016-01-07 ENCOUNTER — Other Ambulatory Visit: Payer: Self-pay | Admitting: Internal Medicine

## 2016-01-07 NOTE — Telephone Encounter (Signed)
Patient's husband came in requesting a medication refill for tylenol 3 and meloxicam Please follow up.

## 2016-01-08 ENCOUNTER — Other Ambulatory Visit: Payer: Self-pay

## 2016-01-08 DIAGNOSIS — M79601 Pain in right arm: Secondary | ICD-10-CM

## 2016-01-08 DIAGNOSIS — M79602 Pain in left arm: Principal | ICD-10-CM

## 2016-01-08 MED ORDER — ACETAMINOPHEN-CODEINE #3 300-30 MG PO TABS: 1.0000 | ORAL_TABLET | Freq: Four times a day (QID) | ORAL | Status: DC | PRN

## 2016-01-08 MED FILL — ACETAMINOPHEN/COD #3 TABLET: 300-30 | 15 days supply | Qty: 60 | Fill #0

## 2016-01-24 NOTE — Telephone Encounter (Signed)
Patient is requesting medication refill for tramadol....please follow up

## 2016-01-25 NOTE — Telephone Encounter (Signed)
Patient's husband called to request a med refill for Tramadol, she has been out for 2 days. Please f/u

## 2016-01-28 ENCOUNTER — Ambulatory Visit (HOSPITAL_COMMUNITY)
Admission: EM | Admit: 2016-01-28 | Discharge: 2016-01-28 | Disposition: A | Discharge status: Home / Self Care | Payer: No Typology Code available for payment source | Attending: Emergency Medicine | Admitting: Emergency Medicine

## 2016-01-28 ENCOUNTER — Encounter (HOSPITAL_COMMUNITY): Payer: Self-pay | Admitting: *Deleted

## 2016-01-28 DIAGNOSIS — Z760 Encounter for issue of repeat prescription: Secondary | ICD-10-CM

## 2016-01-28 MED ORDER — TRAMADOL HCL 50 MG PO TABS: 50.0000 mg | ORAL_TABLET | Freq: Four times a day (QID) | ORAL | Status: DC | PRN

## 2016-01-28 NOTE — ED Provider Notes (Signed)
CSN: [id number]     Arrival date & time 01/28/16  1426 History   First MD Initiated Contact with Patient 01/28/16 1550     Chief Complaint  Patient presents with  . Medication Refill   (Consider location/radiation/quality/duration/timing/severity/associated sxs/prior Treatment) HPI Ongoing history of arthritic pain. Pt states tramadol helps a great deal with pain. Not sleeping at this time. Awaiting follow up with specialist. No new injury but chronic pain in both shoulders.  Past Medical History  Diagnosis Date  . Eczema    Past Surgical History  Procedure Laterality Date  . Cervical prolapse  2006   Family History  Problem Relation Age of Onset  . Stroke Father    Social History  Substance Use Topics  . Smoking status: Never Smoker   . Smokeless tobacco: None  . Alcohol Use: No   OB History    No data available     Review of Systems Needs medication/ pain from arthritis Allergies  Review of patient's allergies indicates no known allergies.  Home Medications   Prior to Admission medications   Medication Sig Start Date End Date Taking? Authorizing Provider  acetaminophen-codeine (TYLENOL #3) 300-30 MG tablet Take 1 tablet by mouth every 6 (six) hours as needed for moderate pain. 01/08/16   Lance Bosch, NP  meloxicam (MOBIC) 15 MG tablet Take 1 tablet (15 mg total) by mouth daily. 11/26/15   Trula Slade, DPM  oxyCODONE-acetaminophen (PERCOCET/ROXICET) 5-325 MG tablet Take 1-2 tablets by mouth every 4 (four) hours as needed for severe pain. 10/15/15   Lorayne Bender, PA-C  predniSONE (DELTASONE) 10 MG tablet Take as directed 11/07/15   Nolon Rod, DO  traMADol (ULTRAM) 50 MG tablet  10/16/15   Historical Provider, MD  traMADol (ULTRAM) 50 MG tablet Take 1 tablet (50 mg total) by mouth every 6 (six) hours as needed. 12/17/15   Lance Bosch, NP  traMADol (ULTRAM) 50 MG tablet Take 1 tablet (50 mg total) by mouth every 6 (six) hours as needed. 01/28/16   Konrad Felix,  PA   Meds Ordered and Administered this Visit  Medications - No data to display  BP 108/68 mmHg  Pulse 62  Temp(Src) 98.9 F (37.2 C) (Oral)  Resp 12  SpO2 100% No data found.   Physical Exam NURSES NOTES AND VITAL SIGNS REVIEWED. CONSTITUTIONAL: Well developed, well nourished, no acute distress HEENT: normocephalic, atraumatic EYES: Conjunctiva normal NECK:normal ROM, supple, no adenopathy PULMONARY:No respiratory distress, normal effort MUSCULOSKELETAL: Normal ROM of all extremities,  SKIN: warm and dry without rash PSYCHIATRIC: Mood and affect, behavior are normal   ED Course  Procedures (including critical care time)  Labs Review Labs Reviewed - No data to display  Imaging Review No results found.   Visual Acuity Review  Right Eye Distance:   Left Eye Distance:   Bilateral Distance:    Right Eye Near:   Left Eye Near:    Bilateral Near:     rx Tramadol   MDM   1. Encounter for medication refill     Patient is reassured that there are no issues that require transfer to higher level of care at this time or additional tests. Patient is advised to continue home symptomatic treatment. Patient is advised that if there are new or worsening symptoms to attend the emergency department, contact primary care provider, or return to UC. Instructions of care provided discharged home in stable condition.    THIS NOTE WAS GENERATED USING  A VOICE RECOGNITION SOFTWARE PROGRAM. ALL REASONABLE EFFORTS  WERE MADE TO PROOFREAD THIS DOCUMENT FOR ACCURACY.  I have verbally reviewed the discharge instructions with the patient. A printed AVS was given to the patient.  All questions were answered prior to discharge.      Konrad Felix, [place] 01/28/16 1730

## 2016-01-28 NOTE — ED Notes (Signed)
Pt  Reports      She  Is   Out    Of  Her    Pain   meds        She       Reports  Was  Told    By  The   [facility]  To  Come  Here     To  Teressa Senter t a  Refill  Of  trammadol

## 2016-01-28 NOTE — Discharge Instructions (Signed)
Medicine Refill at the Emergency Department  We have refilled your medicine today, but it is best for you to get refills through your primary health care provider's office. In the future, please plan ahead so you do not need to get refills from the emergency department.  If the medicine we refilled was a maintenance medicine, you may have received only enough to get you by until you are able to see your regular health care provider.     This information is not intended to replace advice given to you by your health care provider. Make sure you discuss any questions you have with your health care provider.     Document Released: 01/23/2004 Document Revised: 10/27/2014 Document Reviewed: 01/13/2014  Elsevier Interactive Patient Education 2016 [employer]vier Inc[employer]

## 2016-01-29 NOTE — Telephone Encounter (Signed)
Patient received Tramadol on 01/08/16 and received Tylenol 3 on 01/28/16 with 3 additional refills.

## 2016-01-30 NOTE — Telephone Encounter (Signed)
No refills

## 2016-03-03 ENCOUNTER — Encounter: Payer: Self-pay | Admitting: Internal Medicine

## 2016-03-03 ENCOUNTER — Ambulatory Visit: Payer: No Typology Code available for payment source | Attending: Internal Medicine | Admitting: Internal Medicine

## 2016-03-03 VITALS — BP 103/71 | HR 77 | Temp 98.4°F | Resp 18 | Ht 67.0 in | Wt 185.0 lb

## 2016-03-03 DIAGNOSIS — T8389XS Other specified complication of genitourinary prosthetic devices, implants and grafts, sequela: Secondary | ICD-10-CM

## 2016-03-03 DIAGNOSIS — Z0000 Encounter for general adult medical examination without abnormal findings: Secondary | ICD-10-CM

## 2016-03-03 DIAGNOSIS — M79602 Pain in left arm: Secondary | ICD-10-CM | POA: Insufficient documentation

## 2016-03-03 DIAGNOSIS — M79601 Pain in right arm: Secondary | ICD-10-CM

## 2016-03-03 DIAGNOSIS — M542 Cervicalgia: Secondary | ICD-10-CM | POA: Insufficient documentation

## 2016-03-03 DIAGNOSIS — M5010 Cervical disc disorder with radiculopathy, unspecified cervical region: Secondary | ICD-10-CM | POA: Insufficient documentation

## 2016-03-03 DIAGNOSIS — Z79899 Other long term (current) drug therapy: Secondary | ICD-10-CM | POA: Insufficient documentation

## 2016-03-03 DIAGNOSIS — T8332XS Displacement of intrauterine contraceptive device, sequela: Secondary | ICD-10-CM

## 2016-03-03 LAB — BASIC METABOLIC PANEL WITH GFR
BUN: 13 mg/dL (ref 7–25)
CALCIUM: 9.5 mg/dL (ref 8.6–10.2)
CO2: 24 mmol/L (ref 20–31)
CREATININE: 0.55 mg/dL (ref 0.50–1.10)
Chloride: 104 mmol/L (ref 98–110)
GFR, Est Non African American: >89 mL/min (ref >=60)
GLUCOSE: 83 mg/dL (ref 65–99)
Potassium: 4.7 mmol/L (ref 3.5–5.3)
Sodium: 139 mmol/L (ref 135–146)

## 2016-03-03 LAB — CBC WITH DIFFERENTIAL/PLATELET
Basophils Absolute: 84 cells/uL (ref 0–200)
Basophils Relative: 1 %
EOS ABS: 252 {cells}/uL (ref 15–500)
Eosinophils Relative: 3 %
HEMATOCRIT: 39.8 % (ref 35.0–45.0)
Hemoglobin: 13.1 g/dL (ref 11.7–15.5)
LYMPHS PCT: 19 %
Lymphs Abs: 1596 cells/uL (ref 850–3900)
MCH: 28.7 pg (ref 27.0–33.0)
MCHC: 32.9 g/dL (ref 32.0–36.0)
MCV: 87.3 fL (ref 80.0–100.0)
MONO ABS: 420 {cells}/uL (ref 200–950)
MPV: 9.5 fL (ref 7.5–12.5)
Monocytes Relative: 5 %
NEUTROS PCT: 72 %
Neutro Abs: 6048 cells/uL (ref 1500–7800)
Platelets: 383 10*3/uL (ref 140–400)
RBC: 4.56 MIL/uL (ref 3.80–5.10)
RDW: 13.1 % (ref 11.0–15.0)
WBC: 8.4 10*3/uL (ref 3.8–10.8)

## 2016-03-03 LAB — LIPID PANEL
CHOL/HDL RATIO: 4.2 ratio (ref <=5.0)
Cholesterol: 189 mg/dL (ref 125–200)
HDL: 45 mg/dL — AB (ref >=46)
LDL CALC: 125 mg/dL (ref <130)
TRIGLYCERIDES: 97 mg/dL (ref <150)
VLDL: 19 mg/dL (ref <30)

## 2016-03-03 LAB — TSH: TSH: 2.88 mIU/L

## 2016-03-03 MED ORDER — DICLOFENAC SODIUM 1 % TD GEL: 2.0000 g | Freq: Four times a day (QID) | TRANSDERMAL | Status: DC

## 2016-03-03 MED ORDER — TRAMADOL HCL 50 MG PO TABS: 50.0000 mg | ORAL_TABLET | Freq: Four times a day (QID) | ORAL | Status: DC | PRN

## 2016-03-03 MED ORDER — PREDNISONE 10 MG PO TABS: ORAL_TABLET | ORAL | Status: DC

## 2016-03-03 NOTE — Progress Notes (Signed)
Patient is here to Reestablish care.   Patient complains of arthritic neck pain scaled currently at a 10. Pain was diagnosed by MRI resulting severe arthritis.  Patient has not taken medication nor has patient eaten today.  Patient request dental referral and sleep study due to snoring heavily for the past few months.

## 2016-03-03 NOTE — Progress Notes (Signed)
Leah Cain, is a 45 y.o. female  [id number]  [id number]  DOB - 08/03/1971  CC:  Chief Complaint  Patient presents with  . Establish Care    Neck Pain       HPI: Leah Cain is a 45 y.o. 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  Pt recently saw Sports Medicine in 1/17 and was told "arthritis of neck" and no surgical intervention recd.  They recd pain meds per Pt/husband and no heavy lifting.  Per pt and husband, she ran out of her Tramadol about 2 days ago and had very hard time sleeping/getting comfortable.  Per pt, denies neck pains, but has more discomfort and pain at bilateral regions witch radiated down to her bilateral hands including the first 3 digits of both hands, w/ associated numbness, pain and tingling.  She has tried numerous other meds including aleve and motrin w/o help, only Tramadol helps.  Per pt/huband, she has had an IUD in her x 14 years, after complication w/ pelvic lift (for uterine prolapse) 14 years ago; at time IUD not noted prior/during surgery.  Not realized til afterwards. They have seen numerous doctors and all told would need hysterectomy to take it out, which she is not ready for.  Pt has had no menses x 14years.  Patient has No headache, No chest pain, No abdominal pain - No Nausea, No new weakness tingling or numbness, No Cough - SOB.  No Known Allergies Past Medical History  Diagnosis Date  . Eczema    Current Outpatient Prescriptions on File Prior to Visit  Medication Sig Dispense Refill  . acetaminophen-codeine (TYLENOL #3) 300-30 MG tablet Take 1 tablet by mouth every 6 (six) hours as needed for moderate pain. 60 tablet 0  . meloxicam (MOBIC) 15 MG tablet Take 1 tablet (15 mg total) by mouth daily. 30 tablet 2  . oxyCODONE-acetaminophen (PERCOCET/ROXICET) 5-325 MG tablet Take 1-2 tablets by mouth every 4 (four) hours as  needed for severe pain. 15 tablet 0  . traMADol (ULTRAM) 50 MG tablet   0   No current facility-administered medications on file prior to visit.   Family History  Problem Relation Age of Onset  . Stroke Father    Social History   Social History  . Marital Status: Married    Spouse Name: N/A  . Number of Children: N/A  . Years of Education: N/A   Occupational History  . Not on file.   Social History Main Topics  . Smoking status: Never Smoker   . Smokeless tobacco: Not on file  . Alcohol Use: No  . Drug Use: No  . Sexual Activity: Not on file   Other Topics Concern  . Not on file   Social History Narrative    Review of Systems: Constitutional: Negative for fever, chills, diaphoresis, activity change, appetite change and fatigue. HENT: Negative for ear pain, nosebleeds, congestion, facial swelling, rhinorrhea, neck pain, neck stiffness and ear discharge.  + per husband, Pt has been snoring more for last several months. Eyes: Negative for pain, discharge, redness, itching and visual disturbance. Respiratory: Negative for cough, choking, chest tightness, shortness of breath, wheezing and stridor.  Cardiovascular: Negative for chest pain, palpitations and leg swelling. Gastrointestinal: Negative for abdominal distention., bloating, abd pains. Genitourinary: Negative for dysuria, urgency, frequency, hematuria, flank pain, decreased urine volume, difficulty urinating and dyspareunia.  GU: iud retained in uterusk, migrated,  x 14 years. No menses x 14years. Musculoskeletal: Negative for back pain, joint swelling, arthralgia and gait problem. Neurological: Negative for dizziness, tremors, seizures, syncope, facial asymmetry, speech difficulty, weakness, light-headedness, numbness and headaches.  Hematological: Negative for adenopathy. Does not bruise/bleed easily. Psychiatric/Behavioral: Negative for hallucinations, behavioral problems, confusion, dysphoric mood, decreased  concentration and agitation.    Objective:   Filed Vitals:   03/03/16 1538  BP: 103/71  Pulse: 77  Temp: 98.4 F (36.9 C)  Resp: 18    Filed Weights   03/03/16 1538  Weight: 185 lb (83.915 kg)    BP Readings from Last 3 Encounters:  03/03/16 103/71  01/28/16 108/68  11/07/15 119/73    Physical Exam: Constitutional: Patient appears well-developed and well-nourished. No distress. AAOx3, pleasant. HENT: Normocephalic, atraumatic, External right and left ear normal. Oropharynx is clear and moist.  Eyes: Conjunctivae and EOM are normal. PERRL, no scleral icterus. Neck: Normal ROM. Neck supple. No JVD. No carotid bruits bilaterally. No tracheal deviation. No thyromegaly. CVS: RRR, S1/S2 +, no murmurs, no gallops, no carotid bruit.  Pulmonary: Effort and breath sounds normal, no stridor, rhonchi, wheezes, rales.  Abdominal: Soft. BS +, obese, no distension, tenderness, rebound or guarding.  Musculoskeletal: Normal range of motion. No edema and no tenderness. , neck /shoulder pains not reproducible currently on neck extension/flexion. LE: bilat/ no c/c/e, pulses 2+ bilateral. Lymphadenopathy: No lymphadenopathy noted, cervical, inguinal or axillary Neuro: Alert.  muscle tone coordination. No cranial nerve deficit grossly. Skin: Skin is warm and dry. No rash noted. Not diaphoretic. No erythema. No pallor. Psychiatric: Normal mood and affect. Behavior, judgment, thought content normal.  Lab Results  Component Value Date   WBC 12.7* 08/31/2013   HGB 13.0 08/31/2013   HCT 39.3 08/31/2013   MCV 86.9 08/31/2013   PLT 365 08/31/2013   Lab Results  Component Value Date   CREATININE 0.51 01/30/2014   BUN 15 01/30/2014   NA 137 01/30/2014   K 4.8 01/30/2014   CL 104 01/30/2014   CO2 26 01/30/2014    Lab Results  Component Value Date   HGBA1C 5.1 08/31/2013   Lipid Panel  No results found for: CHOL, TRIG, HDL, CHOLHDL, VLDL, LDLCALC     Depression screen [facility] 2/9  03/03/2016 11/07/2015 11/07/2015 10/26/2015 12/26/2013  Decreased Interest 0 0 0 0 0  Down, Depressed, Hopeless 0 0 0 0 0  PHQ - 2 Score 0 0 0 0 0   EXAM:  10/1815 CERVICAL SPINE - COMPLETE 4+ VIEW  COMPARISON: None.  FINDINGS: Disc space narrowing and spurring at C4-5 and C5-6. Normal alignment. Prevertebral soft tissues are normal. No neural foraminal narrowing. No fracture.  IMPRESSION: Early degenerative disc disease at C4-5 and C5-6. No acute findings.   Electronically Signed  By: Rolm Baptise M.D.  On: 11/07/2015 15:58 Assessment and plan:   1. Cervical disc disorder with radiculopathy of cervical region - f/u w/ final sport Medicine recds, but early DJD cerivcal region per xrays, conservative approach recd. - Ambulatory referral to Physical Therapy - Ambulatory referral to Pain Clinic - trial voltaren gel, renewed ultram, prednisone taper x 1  2. IUD migration, sequela -14 years now per pt, denies pain, no menses still  3. Bilateral arm pain., due to #1 - predniSONE (DELTASONE) 10 MG tablet; Take as directed: day 1- 3 take 60mg  po qam (== 6 tabs); day 4-7 take 40mg  (= 4 tabs), day 8-10 take 30mg  po qd (=3 tabs); day 11-13 take 20mg  qam (=  2 tabs), day 14-16 take 1 tab daily, than stop.  Dispense: 48 tablet; Refill: 0 - ultram renewed - trial voltaren gel - stretching exercises discussed, PT eval and treat.  4. Health care maintenance - CBC with Differential - BASIC METABOLIC PANEL WITH GFR - TSH - Vitamin D, 25-hydroxy - Hemoglobin A1c - Lipid Panel - only breakfast today - needs pap smear, to come back for that soon.   Return in about 2 weeks (around 03/17/2016) for pap smear w/ me.  The patient was given clear instructions to go to ER or return to medical center if symptoms don't improve, worsen or new problems develop. The patient verbalized understanding. The patient was told to call to get lab results if they haven't heard anything in the next week.       Maren Reamer, MD, [place], [place]   03/03/2016, 7:55 PM

## 2016-03-04 ENCOUNTER — Other Ambulatory Visit: Payer: Self-pay | Admitting: Internal Medicine

## 2016-03-04 LAB — VITAMIN D 25 HYDROXY (VIT D DEFICIENCY, FRACTURES): Vit D, 25-Hydroxy: 8 ng/mL — ABNORMAL LOW (ref 30–100)

## 2016-03-04 LAB — HEMOGLOBIN A1C
Hgb A1c MFr Bld: 5.4 % (ref <5.7)
Mean Plasma Glucose: 108 mg/dL

## 2016-03-04 MED ORDER — VITAMIN D (ERGOCALCIFEROL) 1.25 MG (50000 UNIT) PO CAPS: 50000.0000 [IU] | ORAL_CAPSULE | ORAL | Status: DC

## 2016-03-05 ENCOUNTER — Other Ambulatory Visit: Payer: Self-pay | Admitting: Internal Medicine

## 2016-03-18 ENCOUNTER — Encounter: Payer: Self-pay | Admitting: Internal Medicine

## 2016-03-18 ENCOUNTER — Ambulatory Visit: Payer: No Typology Code available for payment source | Attending: Internal Medicine | Admitting: Internal Medicine

## 2016-03-18 VITALS — BP 115/83 | HR 75 | Temp 98.3°F | Wt 183.8 lb

## 2016-03-18 DIAGNOSIS — R0683 Snoring: Secondary | ICD-10-CM

## 2016-03-18 DIAGNOSIS — Z124 Encounter for screening for malignant neoplasm of cervix: Secondary | ICD-10-CM

## 2016-03-18 DIAGNOSIS — M5010 Cervical disc disorder with radiculopathy, unspecified cervical region: Secondary | ICD-10-CM

## 2016-03-18 DIAGNOSIS — K029 Dental caries, unspecified: Secondary | ICD-10-CM

## 2016-03-18 MED ORDER — DICLOFENAC SODIUM 1 % TD GEL: 2.0000 g | Freq: Four times a day (QID) | TRANSDERMAL | Status: DC

## 2016-03-18 MED ORDER — ACETAMINOPHEN-CODEINE #3 300-30 MG PO TABS: 1.0000 | ORAL_TABLET | Freq: Four times a day (QID) | ORAL | Status: DC | PRN

## 2016-03-18 MED ORDER — CARBAMIDE PEROXIDE 6.5 % OT SOLN: 5.0000 [drp] | Freq: Two times a day (BID) | OTIC | Status: DC

## 2016-03-18 NOTE — Progress Notes (Addendum)
Leah Cain, is a 45 y.o. female  [id number]  [id number]  DOB - Oct 20, 1971  Chief Complaint  Patient presents with  . Gynecologic Exam        Subjective:   Leah Cain is a 45 y.o. female here today for a follow up visit and pap.  Pt still has significant right shoulder pains, but the votarin gel is helping her quite a bit. She brought in her bottle of Ultram and states she can not tolerated it, to harsh on her stomach. She did better with the Tylenol #3 in past. She has not been to see PT or Pain Mgmt as of yet.  bms normal, no abd pains.  No back pain.  Patient has No headache, No chest pain, No abdominal pain - No Nausea, No new weakness tingling or numbness, No Cough - SOB.  Prior hx of uterine lifting procedure w/ retained IUD post op (>14 years now per pt).  Pt's husband here w/ her today.  Interpreter was used to communicate directly with patient for the entire encounter including providing detailed patient instructions.    No problems updated.  ALLERGIES: No Known Allergies  PAST MEDICAL HISTORY: Past Medical History  Diagnosis Date  . Eczema     MEDICATIONS AT HOME: Prior to Admission medications   Medication Sig Start Date End Date Taking? Authorizing Provider  acetaminophen-codeine (TYLENOL #3) 300-30 MG tablet Take 1 tablet by mouth every 6 (six) hours as needed for moderate pain. 01/08/16   Lance Bosch, NP  acetaminophen-codeine (TYLENOL #3) 300-30 MG tablet Take 1 tablet by mouth every 6 (six) hours as needed for moderate pain. 03/18/16   Maren Reamer, MD  carbamide peroxide (DEBROX) 6.5 % otic solution Place 5 drops into the right ear 2 (two) times daily. 03/18/16   Maren Reamer, MD  diclofenac sodium (VOLTAREN) 1 % GEL Apply 2 g topically 4 (four) times daily. Back of neck/shoulders 03/18/16   Maren Reamer, MD  meloxicam (MOBIC) 15 MG tablet Take 1 tablet (15 mg total) by mouth daily. 11/26/15   Trula Slade, DPM  predniSONE  (DELTASONE) 10 MG tablet Take as directed: day 1- 3 take 60mg  po qam (== 6 tabs); day 4-7 take 40mg  (= 4 tabs), day 8-10 take 30mg  po qd (=3 tabs); day 11-13 take 20mg  qam (= 2 tabs), day 14-16 take 1 tab daily, than stop. 03/03/16   Maren Reamer, MD  Vitamin D, Ergocalciferol, (DRISDOL) 50000 units CAPS capsule Take 1 capsule (50,000 Units total) by mouth every 7 (seven) days. 03/04/16   Maren Reamer, MD     Objective:   Filed Vitals:   03/18/16 1417  BP: 115/83  Pulse: 75  Temp: 98.3 F (36.8 C)  TempSrc: Oral  Weight: 183 lb 12.8 oz (83.371 kg)   CMA present during pelvic exam.  Exam General appearance : Awake, alert, not in any distress. Speech Clear. Not toxic looking HEENT: Atraumatic and Normocephalic, pupils equally reactive to light.  +cerumin impaction right ear.; appears dry.  Left ear w/ good tm light reflex. Neck: supple, no JVD. No cervical lymphadenopathy.  Chest:Good air entry bilaterally, no added sounds. Bilateral breast/adnexa exam: normal, no masses/lesions/discharge noted. CVS: S1 S2 regular, no murmurs/gallups or rubs. Abdomen: Bowel sounds active, obese, Non tender and not distended with no gaurding, rigidity or rebound. Pelvic Exam: Cervix unable to find, external genitalia normal,  Extensive vagina mucosal impeded view and ability to find cervix.  Larger  speculum used w/o success 2nd attempt.   nttp to bimanual exam  Extremities: B/L Lower Ext shows no edema, both legs are warm to touch Neurology: Awake alert, and oriented X 3, CN II-XII grossly intact, Non focal Skin:No Rash  Data Review Lab Results  Component Value Date   HGBA1C 5.4 03/03/2016   HGBA1C 5.1 08/31/2013    Depression screen [facility] 2/9 03/18/2016 03/03/2016 11/07/2015 11/07/2015 10/26/2015  Decreased Interest 0 0 0 0 0  Down, Depressed, Hopeless 0 0 0 0 0  PHQ - 2 Score 0 0 0 0 0  Altered sleeping 0 - - - -  Tired, decreased energy 0 - - - -  Change in appetite 0 - - - -  Feeling bad  or failure about yourself  0 - - - -  Trouble concentrating 0 - - - -  Moving slowly or fidgety/restless 0 - - - -  Suicidal thoughts 0 - - - -  PHQ-9 Score 0 - - - -  Difficult doing work/chores Not difficult at all - - - -      Assessment & Plan   1. Pap smear for cervical cancer screening  - unsuccessful. - Cytology - PAP unsuccessful, too numerous vaginal mucosal, unable to clearly find cervix., prior uterine lift per pt, w/ retained IUD complication ([id number] per pt)   - amb ref for ob-gyn for further eval  2. Snoring R/o osa - Split night study; Future  3. Dental caries No active infection noted. - Ambulatory referral to Dentistry  4. Cervical disc disorder with radiculopathy of cervical region - Pain still bad, but improved slightly w/ voltarin gel - pt did not tol ultram, she brought in her almost full bottle of ultram /gi s/es - per pt, tylenol #3 in past worked well, renewed - went over pain contract w/ pt and husband, pt signed it in front of me. - f/u PT /PMR when appts obtained  5. Right ear cerumin impaction, appears dry - trial debrox     Patient have been counseled extensively about nutrition and exercise  Return in about 3 months (around 06/18/2016), or if symptoms worsen or fail to improve.  The patient was given clear instructions to go to ER or return to medical center if symptoms don't improve, worsen or new problems develop. The patient verbalized understanding. The patient was told to call to get lab results if they haven't heard anything in the next week.    Maren Reamer, MD, [place] and [facility], [place]   03/18/2016, 2:51 PM

## 2016-03-18 NOTE — Addendum Note (Signed)
Addended by: Lottie Mussel T on: 03/18/2016 06:52 PM   Modules accepted: Miquel Dunn

## 2016-03-18 NOTE — Addendum Note (Signed)
Addended byLottie Mussel T on: 03/18/2016 04:59 PM   Modules accepted: Miquel Dunn

## 2016-03-25 ENCOUNTER — Telehealth: Payer: Self-pay | Admitting: *Deleted

## 2016-03-25 ENCOUNTER — Telehealth: Payer: Self-pay | Admitting: Internal Medicine

## 2016-03-25 NOTE — Telephone Encounter (Signed)
Pt. Calling to receive her blood test results  Please call back

## 2016-03-25 NOTE — Telephone Encounter (Signed)
-----   Message from Maren Reamer, MD sent at 03/04/2016  8:29 AM EDT ----- Please call w/ results.  Cholesterol looks good. Keep up the good diet.  Very low vit D, which could contribute to her bone/muscle pains. rx Vit D in chart, take weekly for 2months, than we will rechk.  No diabetes.  Thyroid, blood count and kidneys normal as well. Thanks.

## 2016-03-25 NOTE — Telephone Encounter (Signed)
Medical Assistant used [employer] to contact patient.  Interpreter Name: Davy Pique Interpreter #: [phone]  MA provided husband with the call back number to [facility] for patient to receive her results or authorize her husband to speak on her behalf.

## 2016-04-01 ENCOUNTER — Telehealth: Payer: Self-pay | Admitting: Internal Medicine

## 2016-04-01 NOTE — Telephone Encounter (Signed)
Pts husband is calling stating that pt would like to be prescribed tramadol again.  He states that she stopped taking tramadol due to headaches but says that the headaches were coming from the fasting instead of the medication  Please assist  Thank you

## 2016-04-04 ENCOUNTER — Telehealth: Payer: Self-pay | Admitting: Internal Medicine

## 2016-04-04 NOTE — Telephone Encounter (Signed)
Medication Refill: acetaminophen-codeine (TYLENOL #3) 300-30 MG tablet  Pts husband came in stating that the pt is out of tylenol 3 Please assist. Pts husband would like a call back to know status of medication  Thank you

## 2016-04-10 NOTE — Telephone Encounter (Signed)
MA spoke with patients husband and reiterated the pain contract which his wife signed.

## 2016-05-12 ENCOUNTER — Encounter: Payer: Self-pay | Admitting: Obstetrics & Gynecology

## 2016-05-12 ENCOUNTER — Encounter (HOSPITAL_BASED_OUTPATIENT_CLINIC_OR_DEPARTMENT_OTHER): Payer: Self-pay

## 2016-05-27 ENCOUNTER — Ambulatory Visit: Payer: Self-pay | Attending: Internal Medicine | Admitting: Internal Medicine

## 2016-05-27 ENCOUNTER — Encounter: Payer: Self-pay | Admitting: Internal Medicine

## 2016-05-27 VITALS — BP 105/72 | HR 70 | Temp 98.2°F | Resp 16 | Wt 182.0 lb

## 2016-05-27 DIAGNOSIS — N63 Unspecified lump in breast: Secondary | ICD-10-CM

## 2016-05-27 DIAGNOSIS — K029 Dental caries, unspecified: Secondary | ICD-10-CM

## 2016-05-27 DIAGNOSIS — M545 Low back pain, unspecified: Secondary | ICD-10-CM

## 2016-05-27 DIAGNOSIS — IMO0001 Reserved for inherently not codable concepts without codable children: Secondary | ICD-10-CM

## 2016-05-27 DIAGNOSIS — N6320 Unspecified lump in the left breast, unspecified quadrant: Secondary | ICD-10-CM

## 2016-05-27 MED ORDER — CYCLOBENZAPRINE HCL 5 MG PO TABS: 5.0000 mg | ORAL_TABLET | Freq: Three times a day (TID) | ORAL | 1 refills | Status: DC | PRN

## 2016-05-27 MED ORDER — ACETAMINOPHEN-CODEINE #3 300-30 MG PO TABS: 1.0000 | ORAL_TABLET | Freq: Four times a day (QID) | ORAL | 0 refills | Status: DC | PRN

## 2016-05-27 MED FILL — ACETAMINOPHEN/COD #3 TABLET: 300-30 | 15 days supply | Qty: 60 | Fill #0

## 2016-05-27 NOTE — Progress Notes (Signed)
Leah Cain, is a 45 y.o. female  [id number]  ID:[id number]  DOB - [birth year]  Chief Complaint  Patient presents with  . Mass        Subjective:   Leah Cain is a 45 y.o. female here today for a follow up visit, w/ concerns of breast mass in left breast, 7'clock.  Also states lower back pain has been worse, ran out of tylenol #3, mainly left sided.  No urinary/stool incontinence, bms nml, no dysuria.   Still has not seen ob for IUD/papsmear, prior uterine lift (IUD has been there at least 4yrs.)   Patient has No headache, No chest pain, No abdominal pain - No Nausea, No new weakness tingling or numbness, No Cough - SOB.  No problems updated.  ALLERGIES: No Known Allergies  PAST MEDICAL HISTORY: Past Medical History:  Diagnosis Date  . Eczema     MEDICATIONS AT HOME: Prior to Admission medications   Medication Sig Start Date End Date Taking? Authorizing Provider  acetaminophen-codeine (TYLENOL #3) 300-30 MG tablet Take 1 tablet by mouth every 6 (six) hours as needed for moderate pain. 05/27/16  Yes Maren Reamer, MD  diclofenac sodium (VOLTAREN) 1 % GEL Apply 2 g topically 4 (four) times daily. Back of neck/shoulders 03/18/16  Yes Maren Reamer, MD  meloxicam (MOBIC) 15 MG tablet Take 1 tablet (15 mg total) by mouth daily. 11/26/15  Yes Trula Slade, DPM  cyclobenzaprine (FLEXERIL) 5 MG tablet Take 1 tablet (5 mg total) by mouth 3 (three) times daily as needed for muscle spasms. 05/27/16   Maren Reamer, MD     Objective:   Vitals:   05/27/16 1121  BP: 105/72  Pulse: 70  Resp: 16  Temp: 98.2 F (36.8 C)  TempSrc: Oral  SpO2: 97%  Weight: 182 lb (82.6 kg)    Exam General appearance : Awake, alert, not in any distress. Speech Clear. Not toxic looking HEENT: Atraumatic and Normocephalic, pupils equally reactive to light. Neck: supple, no JVD. No cervical lymphadenopathy.  Chest:Good air entry bilaterally, no added sounds. Breast/axilla;   Possible muscle noted at 7 O'clock on Left breast, not true palpable mass on exam, not mobile,  Right breast no acute findings. CVS: S1 S2 regular, no murmurs/gallups or rubs. Abdomen: Bowel sounds active, Non tender and not distended with no gaurding, rigidity or rebound. Extremities: B/L Lower Ext shows no edema, both legs are warm to touch Mild ttp midline lower back and left sided low back, neg straight leg test bilat, no radiculopathy Neurology: Awake alert, and oriented X 3, CN II-XII grossly intact, Non focal Skin:No Rash  Data Review Lab Results  Component Value Date   HGBA1C 5.4 03/03/2016   HGBA1C 5.1 08/31/2013    Depression screen [facility] 2/9 05/27/2016 03/18/2016 03/03/2016 11/07/2015 11/07/2015  Decreased Interest 0 0 0 0 0  Down, Depressed, Hopeless 0 0 0 0 0  PHQ - 2 Score 0 0 0 0 0  Altered sleeping - 0 - - -  Tired, decreased energy - 0 - - -  Change in appetite - 0 - - -  Feeling bad or failure about yourself  - 0 - - -  Trouble concentrating - 0 - - -  Moving slowly or fidgety/restless - 0 - - -  Suicidal thoughts - 0 - - -  PHQ-9 Score - 0 - - -  Difficult doing work/chores - Not difficult at all - - -  Assessment & Plan   1. Breast mass, left, 7 o'clock ? - MM DIAG BREAST TOMO BILATERAL; Future  2. Cavities - Ambulatory referral to Dentistry  3. left-sided low back pain without sciatica - trial flexeril/tylenol #3, - DG Lumbar Spine Complete; Future - recd RICE, warm heat, back exercises discussed, - less meds the better.  4. Hx if retained  IUD (>14 years now) post uterine lift procedure, due for pap smear as well - ob gyn referral placed in 5/17, resent email to referral specialist to chk on this   Patient have been counseled extensively about nutrition and exercise  Return in about 3 months (around 08/27/2016).  The patient was given clear instructions to go to ER or return to medical center if symptoms don't improve, worsen or new problems  develop. The patient verbalized understanding. The patient was told to call to get lab results if they haven't heard anything in the next week.   This note has been created with Surveyor, quantity. Any transcriptional errors are unintentional.   Maren Reamer, MD, [facility] [place], [place]   05/27/2016, 11:48 AM

## 2016-05-27 NOTE — Patient Instructions (Signed)
RICE for Routine Care of Injuries /back pain Many injuries can be cared for using rest, ice, compression, and elevation (RICE therapy). Using RICE therapy can help to lessen pain and swelling. It can help your body to heal. Rest Reduce your normal activities and avoid using the injured part of your body. You can go back to your normal activities when you feel okay and your doctor says it is okay. Ice Do not put ice on your bare skin.  Put ice in a plastic bag.  Place a towel between your skin and the bag.  Leave the ice on for 20 minutes, 2-3 times a day. Do this for as long as told by your doctor. Compression Compression means putting pressure on the injured area. This can be done with an elastic bandage. If an elastic bandage has been applied:  Remove and reapply the bandage every 3-4 hours or as told by your doctor.  Make sure the bandage is not wrapped too tight. Wrap the bandage more loosely if part of your body beyond the bandage is blue, swollen, cold, painful, or loses feeling (numb).  See your doctor if the bandage seems to make your problems worse. Elevation Elevation means keeping the injured area raised. Raise the injured area above your heart or the center of your chest if you can. WHEN SHOULD I GET HELP? You should get help if:  You keep having pain and swelling.  Your symptoms get worse. WHEN SHOULD I GET HELP RIGHT AWAY? You should get help right away if:  You have sudden bad pain at or below the area of your injury.  You have redness or more swelling around your injury.  You have tingling or numbness at or below the injury that does not go away when you take off the bandage.   This information is not intended to replace advice given to you by your health care provider. Make sure you discuss any questions you have with your health care provider.   Document Released: 03/24/2008 Document Revised: 06/27/2015 Document Reviewed: 09/13/2014 Elsevier Interactive  Patient Education 2016 Elsevier Inc.  -  Back Pain, Adult Back pain is very common in adults.The cause of back pain is rarely dangerous and the pain often gets better over time.The cause of your back pain may not be known. Some common causes of back pain include:  Strain of the muscles or ligaments supporting the spine.  Wear and tear (degeneration) of the spinal disks.  Arthritis.  Direct injury to the back. For many people, back pain may return. Since back pain is rarely dangerous, most people can learn to manage this condition on their own. HOME CARE INSTRUCTIONS Watch your back pain for any changes. The following actions may help to lessen any discomfort you are feeling:  Remain active. It is stressful on your back to sit or stand in one place for long periods of time. Do not sit, drive, or stand in one place for more than 30 minutes at a time. Take short walks on even surfaces as soon as you are able.Try to increase the length of time you walk each day.  Exercise regularly as directed by your health care provider. Exercise helps your back heal faster. It also helps avoid future injury by keeping your muscles strong and flexible.  Do not stay in bed.Resting more than 1-2 days can delay your recovery.  Pay attention to your body when you bend and lift. The most comfortable positions are those that put less  stress on your recovering back. Always use proper lifting techniques, including:  Bending your knees.  Keeping the load close to your body.  Avoiding twisting.  Find a comfortable position to sleep. Use a firm mattress and lie on your side with your knees slightly bent. If you lie on your back, put a pillow under your knees.  Avoid feeling anxious or stressed.Stress increases muscle tension and can worsen back pain.It is important to recognize when you are anxious or stressed and learn ways to manage it, such as with exercise.  Take medicines only as directed by your  health care provider. Over-the-counter medicines to reduce pain and inflammation are often the most helpful.Your health care provider may prescribe muscle relaxant drugs.These medicines help dull your pain so you can more quickly return to your normal activities and healthy exercise.  Apply ice to the injured area:  Put ice in a plastic bag.  Place a towel between your skin and the bag.  Leave the ice on for 20 minutes, 2-3 times a day for the first 2-3 days. After that, ice and heat may be alternated to reduce pain and spasms.  Maintain a healthy weight. Excess weight puts extra stress on your back and makes it difficult to maintain good posture. SEEK MEDICAL CARE IF:  You have pain that is not relieved with rest or medicine.  You have increasing pain going down into the legs or buttocks.  You have pain that does not improve in one week.  You have night pain.  You lose weight.  You have a fever or chills. SEEK IMMEDIATE MEDICAL CARE IF:   You develop new bowel or bladder control problems.  You have unusual weakness or numbness in your arms or legs.  You develop nausea or vomiting.  You develop abdominal pain.  You feel faint.   This information is not intended to replace advice given to you by your health care provider. Make sure you discuss any questions you have with your health care provider.   Document Released: 10/06/2005 Document Revised: 10/27/2014 Document Reviewed: 02/07/2014 Elsevier Interactive Patient Education 2016 Elsevier Inc.    -  Back Exercises If you have pain in your back, do these exercises 2-3 times each day or as told by your doctor. When the pain goes away, do the exercises once each day, but repeat the steps more times for each exercise (do more repetitions). If you do not have pain in your back, do these exercises once each day or as told by your doctor. EXERCISES Single Knee to Chest Do these steps 3-5 times in a row for each  leg: 1. Lie on your back on a firm bed or the floor with your legs stretched out. 2. Bring one knee to your chest. 3. Hold your knee to your chest by grabbing your knee or thigh. 4. Pull on your knee until you feel a gentle stretch in your lower back. 5. Keep doing the stretch for 10-30 seconds. 6. Slowly let go of your leg and straighten it. Pelvic Tilt Do these steps 5-10 times in a row: 1. Lie on your back on a firm bed or the floor with your legs stretched out. 2. Bend your knees so they point up to the ceiling. Your feet should be flat on the floor. 3. Tighten your lower belly (abdomen) muscles to press your lower back against the floor. This will make your tailbone point up to the ceiling instead of pointing down to your feet  or the floor. 4. Stay in this position for 5-10 seconds while you gently tighten your muscles and breathe evenly. Cat-Cow Do these steps until your lower back bends more easily: 1. Get on your hands and knees on a firm surface. Keep your hands under your shoulders, and keep your knees under your hips. You may put padding under your knees. 2. Let your head hang down, and make your tailbone point down to the floor so your lower back is round like the back of a cat. 3. Stay in this position for 5 seconds. 4. Slowly lift your head and make your tailbone point up to the ceiling so your back hangs low (sags) like the back of a cow. 5. Stay in this position for 5 seconds. Press-Ups Do these steps 5-10 times in a row: 1. Lie on your belly (face-down) on the floor. 2. Place your hands near your head, about shoulder-width apart. 3. While you keep your back relaxed and keep your hips on the floor, slowly straighten your arms to raise the top half of your body and lift your shoulders. Do not use your back muscles. To make yourself more comfortable, you may change where you place your hands. 4. Stay in this position for 5 seconds. 5. Slowly return to lying flat on the  floor. Bridges Do these steps 10 times in a row: 1. Lie on your back on a firm surface. 2. Bend your knees so they point up to the ceiling. Your feet should be flat on the floor. 3. Tighten your butt muscles and lift your butt off of the floor until your waist is almost as high as your knees. If you do not feel the muscles working in your butt and the back of your thighs, slide your feet 1-2 inches farther away from your butt. 4. Stay in this position for 3-5 seconds. 5. Slowly lower your butt to the floor, and let your butt muscles relax. If this exercise is too easy, try doing it with your arms crossed over your chest. Belly Crunches Do these steps 5-10 times in a row: 1. Lie on your back on a firm bed or the floor with your legs stretched out. 2. Bend your knees so they point up to the ceiling. Your feet should be flat on the floor. 3. Cross your arms over your chest. 4. Tip your chin a little bit toward your chest but do not bend your neck. 5. Tighten your belly muscles and slowly raise your chest just enough to lift your shoulder blades a tiny bit off of the floor. 6. Slowly lower your chest and your head to the floor. Back Lifts Do these steps 5-10 times in a row: 1. Lie on your belly (face-down) with your arms at your sides, and rest your forehead on the floor. 2. Tighten the muscles in your legs and your butt. 3. Slowly lift your chest off of the floor while you keep your hips on the floor. Keep the back of your head in line with the curve in your back. Look at the floor while you do this. 4. Stay in this position for 3-5 seconds. 5. Slowly lower your chest and your face to the floor. GET HELP IF:  Your back pain gets a lot worse when you do an exercise.  Your back pain does not lessen 2 hours after you exercise. If you have any of these problems, stop doing the exercises. Do not do them again unless your doctor says  it is okay. GET HELP RIGHT AWAY IF:  You have sudden, very  bad back pain. If this happens, stop doing the exercises. Do not do them again unless your doctor says it is okay.   This information is not intended to replace advice given to you by your health care provider. Make sure you discuss any questions you have with your health care provider.   Document Released: 11/08/2010 Document Revised: 06/27/2015 Document Reviewed: 11/30/2014 Elsevier Interactive Patient Education [employer].

## 2016-06-02 ENCOUNTER — Ambulatory Visit: Payer: Self-pay | Attending: Internal Medicine

## 2016-06-04 ENCOUNTER — Encounter: Payer: Self-pay | Attending: Physical Medicine & Rehabilitation | Admitting: Physical Medicine & Rehabilitation

## 2016-06-04 ENCOUNTER — Encounter: Payer: Self-pay | Admitting: Physical Medicine & Rehabilitation

## 2016-06-04 VITALS — BP 111/75 | HR 59 | Resp 17

## 2016-06-04 DIAGNOSIS — G894 Chronic pain syndrome: Secondary | ICD-10-CM

## 2016-06-04 DIAGNOSIS — M25512 Pain in left shoulder: Secondary | ICD-10-CM | POA: Insufficient documentation

## 2016-06-04 DIAGNOSIS — G479 Sleep disorder, unspecified: Secondary | ICD-10-CM | POA: Insufficient documentation

## 2016-06-04 DIAGNOSIS — G8929 Other chronic pain: Secondary | ICD-10-CM | POA: Insufficient documentation

## 2016-06-04 DIAGNOSIS — M542 Cervicalgia: Secondary | ICD-10-CM | POA: Insufficient documentation

## 2016-06-04 DIAGNOSIS — M79642 Pain in left hand: Secondary | ICD-10-CM | POA: Insufficient documentation

## 2016-06-04 MED ORDER — TRAMADOL HCL 50 MG PO TABS: 50.0000 mg | ORAL_TABLET | Freq: Four times a day (QID) | ORAL | 0 refills | Status: DC | PRN

## 2016-06-04 MED ORDER — AMITRIPTYLINE HCL 150 MG PO TABS: 150.0000 mg | ORAL_TABLET | Freq: Every day | ORAL | 1 refills | Status: DC

## 2016-06-04 MED FILL — AMITRIPTYLINE HCL 150 MG TA: 150 | 37 days supply | Qty: 30 | Fill #0

## 2016-06-04 MED FILL — traMADol HCL 50 MG TABS: 50 | 30 days supply | Qty: 120 | Fill #0

## 2016-06-04 NOTE — Patient Instructions (Signed)
Please call to schedule appointment with [facility] physical therapy [phone], please request trial of TENS unit

## 2016-06-04 NOTE — Progress Notes (Signed)
Subjective:    Patient ID: Leah Cain, female    DOB: [birth year], 45 y.o.   MRN: [id number]  HPI  45 y/o female with no significant pmh of cervical neck pain presents with b/l cervical neck pain.  Started 06/2015 when they were moving into a new house, getting progressively worse.  They were lifting a lot of heavy boxes, but she cannot identify an acute incident.  During the day it gets better, but worse overnight. Carrying objects exacerbates the pain as well as reaching overhead.  It is a sharp pain.  It radiates to her 1st 3 digits of her left hand.  Pain is constant.  Today, 5/10.  She has tried IBU, Tylenol #3 without benefit.  She has associated numbness and weakness in the morning.  Denies falls. The pain inhibits her from activities of daily living and all enjoyable activities.  She has trouble sleeping.  She recently changed her mattress without any benefit.  She tends to sleep on her back and side.   Pain Inventory Average Pain 10 Pain Right Now 8 My pain is constant and stabbing  In the last 24 hours, has pain interfered with the following? General activity NA Relation with others NA Enjoyment of life NA What TIME of day is your pain at its worst? morning, daytime, evening, night Sleep (in general) Poor  Pain is worse with: NA Pain improves with: rest Relief from Meds: 0  Mobility walk without assistance how many minutes can you walk? 20 ability to climb steps?  no do you drive?  yes  Function Do you have any goals in this area?  no  Neuro/Psych tingling spasms  Prior Studies initial visit   Physicians involved in your care Primary care Dr. Janne Napoleon    Family History  Problem Relation Age of Onset  . Stroke Father    Social History   Social History  . Marital status: Married    Spouse name: N/A  . Number of children: N/A  . Years of education: N/A   Social History Main Topics  . Smoking status: Never Smoker  . Smokeless tobacco: Never Used  .  Alcohol use No  . Drug use: No  . Sexual activity: Not Asked   Other Topics Concern  . None   Social History Narrative  . None   Past Surgical History:  Procedure Laterality Date  . cervical prolapse  2006   Past Medical History:  Diagnosis Date  . Eczema    BP 111/75   Pulse (!) 59   Resp 17   SpO2 99%   Opioid Risk Score:   Fall Risk Score:  `1  Depression screen PHQ 2/9  Depression screen [facility] 2/9 06/04/2016 05/27/2016 03/18/2016 03/03/2016 11/07/2015 11/07/2015 10/26/2015  Decreased Interest 0 0 0 0 0 0 0  Down, Depressed, Hopeless 0 0 0 0 0 0 0  PHQ - 2 Score 0 0 0 0 0 0 0  Altered sleeping 0 - 0 - - - -  Tired, decreased energy 0 - 0 - - - -  Change in appetite 0 - 0 - - - -  Feeling bad or failure about yourself  0 - 0 - - - -  Trouble concentrating 0 - 0 - - - -  Moving slowly or fidgety/restless 0 - 0 - - - -  Suicidal thoughts 0 - 0 - - - -  PHQ-9 Score 0 - 0 - - - -  Difficult doing work/chores Not  difficult at all - Not difficult at all - - - -    Review of Systems  Neurological:       Tingling Spasms   All other systems reviewed and are negative.      Objective:   Physical Exam Gen: NAD. Vital signs reviewed HENT: Normocephalic, Atraumatic Eyes: EOMI, Conj WNL Cardio: S1, S2 normal, RRR Pulm: B/l clear to auscultation.  Effort normal Abd: Soft, non-distended, non-tender, BS+ MSK:  Gait WNL.   TTP right arm >> near triceps and APB.    No edema.   Equivocal Speed's, Yergeson's, Hawkin's due to apprehension of future pain  Limitted ROM LUE due to concern of future pain Neuro: CN II-XII grossly intact.    Sensation intact to light touch in all UE dermatomes  Reflexes 2+ throughout  Strength  5/5 in all RUE myotomes    4/5 in all LUE myotomes (pain inhibition) Skin: Warm and Dry    Assessment & Plan:  45 y/o female with no significant pmh of cervical neck pain presents with b/l cervical neck pain.  1. Left shoulder pain  Will order Xray  shoulder to rule out pathology  She was already referred to PT, pt to schedule appointment  Will refill Tramadol 50mg  Q6 hours (educated on signs symptoms of serotonin syndrome)  Will likely need MRI of neck for concern of C7,8 involvement +/- NCS/EMG  Pt currently under pain contract   2. Left hand pain  Possibly CTS, but may be more proximal  Will order Elavil 75mg , may increase to 150mg  after 2 weeks if incomplete efficacy and no side effects  Will order bracing  3. Sleep disturbance  Recent mattress change  Will order Elavil 75mg , may increase to 150mg  after 2 weeks if incomplete efficacy and no side effects  4. Cervical neck pain  Early DDD C4-C6  Will likely need MRI of neck for concern of C7,8 involvement

## 2016-06-05 ENCOUNTER — Ambulatory Visit: Payer: Self-pay | Admitting: Physical Medicine & Rehabilitation

## 2016-07-02 ENCOUNTER — Encounter: Payer: Self-pay | Attending: Physical Medicine & Rehabilitation | Admitting: Physical Medicine & Rehabilitation

## 2016-07-02 ENCOUNTER — Encounter: Payer: Self-pay | Admitting: Physical Medicine & Rehabilitation

## 2016-07-02 VITALS — BP 117/81 | HR 73 | Resp 17

## 2016-07-02 DIAGNOSIS — M25512 Pain in left shoulder: Secondary | ICD-10-CM | POA: Insufficient documentation

## 2016-07-02 DIAGNOSIS — G479 Sleep disorder, unspecified: Secondary | ICD-10-CM | POA: Insufficient documentation

## 2016-07-02 DIAGNOSIS — G894 Chronic pain syndrome: Secondary | ICD-10-CM

## 2016-07-02 DIAGNOSIS — M542 Cervicalgia: Secondary | ICD-10-CM | POA: Insufficient documentation

## 2016-07-02 DIAGNOSIS — M79642 Pain in left hand: Secondary | ICD-10-CM | POA: Insufficient documentation

## 2016-07-02 DIAGNOSIS — G8929 Other chronic pain: Secondary | ICD-10-CM | POA: Insufficient documentation

## 2016-07-02 MED ORDER — AMITRIPTYLINE HCL 25 MG PO TABS: 25.0000 mg | ORAL_TABLET | Freq: Every day | ORAL | 1 refills | Status: DC

## 2016-07-02 MED ORDER — METHOCARBAMOL 500 MG PO TABS: 500.0000 mg | ORAL_TABLET | Freq: Four times a day (QID) | ORAL | 1 refills | Status: DC | PRN

## 2016-07-02 MED ORDER — TRAMADOL HCL 50 MG PO TABS: 100.0000 mg | ORAL_TABLET | Freq: Four times a day (QID) | ORAL | 0 refills | Status: DC | PRN

## 2016-07-02 MED FILL — traMADol HCL 50 MG TABS: 50 | 30 days supply | Qty: 240 | Fill #0

## 2016-07-02 NOTE — Patient Instructions (Signed)
Please follow up on bracing for RUE Please follow up PT Please follow up with Xray right shoulder

## 2016-07-02 NOTE — Progress Notes (Signed)
Subjective:    Patient ID: Leah Cain, female    DOB: 12-Nov-1970, 45 y.o.   MRN: [id number]  HPI  45 y/o female with no significant pmh of cervical neck pain presents with b/l cervical neck pain.  Started 06/2015 when they were moving into a new house, getting progressively worse.  They were lifting a lot of heavy boxes, but she cannot identify an acute incident.  During the day it gets better, but worse overnight. Carrying objects exacerbates the pain as well as reaching overhead.  It is a sharp pain.  It radiates to her 1st 3 digits of her left hand.  Pain is constant.  She has tried IBU, Tylenol #3 without benefit.  She has associated numbness and weakness in the morning.  The pain inhibits her from activities of daily living and all enjoyable activities.  She has changed her mattress without any benefit.  She tends to sleep on her back and side.   Last clinic visit 06/04/16.  No falls since last visit.  She has not seen PT either because of issues with insurance.  Her husband (who does all of the talking) states the tramadol did not help with 50mg , but helped with 100mg .  The elavil made her too drowsy.  She has not received a brace for her left hand.  Overall, she is doing better since last visit.    Pain Inventory Average Pain 10 Pain Right Now 7 My pain is NA  In the last 24 hours, has pain interfered with the following? General activity 6 Relation with others NA Enjoyment of life 8 What TIME of day is your pain at its worst? night Sleep (in general) Poor  Pain is worse with: NA Pain improves with: rest and pacing activities Relief from Meds: 6  Mobility Do you have any goals in this area?  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  no  Physicians involved in your care Any changes since last visit?  no Primary care Dr. Janne Napoleon    Family History  Problem Relation Age of Onset  .  Stroke Father    Social History   Social History  . Marital status: Married    Spouse name: N/A  . Number of children: N/A  . Years of education: N/A   Social History Main Topics  . Smoking status: Never Smoker  . Smokeless tobacco: Never Used  . Alcohol use No  . Drug use: No  . Sexual activity: Not Asked   Other Topics Concern  . None   Social History Narrative  . None   Past Surgical History:  Procedure Laterality Date  . cervical prolapse  2006   Past Medical History:  Diagnosis Date  . Eczema    BP 117/81   Pulse 73   Resp 17   SpO2 97%   Opioid Risk Score:   Fall Risk Score:  `1  Depression screen PHQ 2/9  Depression screen [facility] 2/9 06/04/2016 05/27/2016 03/18/2016 03/03/2016 11/07/2015 11/07/2015 10/26/2015  Decreased Interest 0 0 0 0 0 0 0  Down, Depressed, Hopeless 0 0 0 0 0 0 0  PHQ - 2 Score 0 0 0 0 0 0 0  Altered sleeping 0 - 0 - - - -  Tired, decreased energy 0 - 0 - - - -  Change in appetite 0 - 0 - - - -  Feeling bad  or failure about yourself  0 - 0 - - - -  Trouble concentrating 0 - 0 - - - -  Moving slowly or fidgety/restless 0 - 0 - - - -  Suicidal thoughts 0 - 0 - - - -  PHQ-9 Score 0 - 0 - - - -  Difficult doing work/chores Not difficult at all - Not difficult at all - - - -    Review of Systems  Neurological:       Tingling Spasms   All other systems reviewed and are negative.     Objective:   Physical Exam Gen: NAD. Vital signs reviewed HENT: Normocephalic, Atraumatic Eyes: EOMI, Conj WNL Cardio: S1, S2 normal, RRR Pulm: B/l clear to auscultation.  Effort normal Abd: Soft, non-distended, non-tender, BS+ MSK:  Gait WNL.   TTP right arm >> near triceps and APB.    No edema.   Equivocal Speed's, Yergeson's, Hawkin's due to apprehension of future pain  Limitted ROM LUE due to concern of future pain Neuro: CN II-XII grossly intact.    Sensation intact to light touch in all UE dermatomes  Reflexes 2+ throughout  Strength  5/5 in all  RUE myotomes    4/5 in all LUE myotomes (pain inhibition) Skin: Warm and Dry    Assessment & Plan:  45 y/o female with no significant pmh of cervical neck pain presents with b/l cervical neck pain.  1. Left shoulder pain  She was already referred to PT, pt to schedule appointment  Will increase Tramadol to 100mg  Q6 hours (educated on signs symptoms of serotonin syndrome)  Will order NCS/EMG   Will consider MRI of neck for concern of C7,8 involvement  Mobic 15mg  daily  Will order Robaxin 500 4/day PRN   2. Left hand pain  Possibly CTS, but may be more proximal  Will order NCS/EMG  Will decrease Elavil to 25mg   Pt to follow up on bracing  Pt to follow up on Xray shoulder to rule out pathology - early degenerative changes C4-6  3. Sleep disturbance  Recent mattress change  Will decrease Elavil to 25mg  due to excessive fatigue at higher dose  4. Cervical neck pain  Early DDD C4-C6  NCS/EMG ordered  Will consider MRI in future

## 2016-07-04 ENCOUNTER — Encounter: Payer: Self-pay | Admitting: Family Medicine

## 2016-07-04 ENCOUNTER — Encounter: Payer: Self-pay | Admitting: General Practice

## 2016-07-04 ENCOUNTER — Ambulatory Visit (INDEPENDENT_AMBULATORY_CARE_PROVIDER_SITE_OTHER): Payer: Self-pay | Admitting: Family Medicine

## 2016-07-04 VITALS — BP 112/82 | HR 77 | Ht 61.0 in | Wt 180.0 lb

## 2016-07-04 DIAGNOSIS — Z124 Encounter for screening for malignant neoplasm of cervix: Secondary | ICD-10-CM

## 2016-07-04 DIAGNOSIS — Z1239 Encounter for other screening for malignant neoplasm of breast: Secondary | ICD-10-CM

## 2016-07-04 DIAGNOSIS — T8389XD Other specified complication of genitourinary prosthetic devices, implants and grafts, subsequent encounter: Secondary | ICD-10-CM

## 2016-07-04 DIAGNOSIS — T8332XD Displacement of intrauterine contraceptive device, subsequent encounter: Secondary | ICD-10-CM

## 2016-07-04 DIAGNOSIS — Z23 Encounter for immunization: Secondary | ICD-10-CM

## 2016-07-04 NOTE — Patient Instructions (Signed)
Preventive Care for Adults, Female A healthy lifestyle and preventive care can promote health and wellness. Preventive health guidelines for women include the following key practices.  A routine yearly physical is a good way to check with your health care provider about your health and preventive screening. It is a chance to share any concerns and updates on your health and to receive a thorough exam.  Visit your dentist for a routine exam and preventive care every 6 months. Brush your teeth twice a day and floss once a day. Good oral hygiene prevents tooth decay and gum disease.  The frequency of eye exams is based on your age, health, family medical history, use of contact lenses, and other factors. Follow your health care provider's recommendations for frequency of eye exams.  Eat a healthy diet. Foods like vegetables, fruits, whole grains, low-fat dairy products, and lean protein foods contain the nutrients you need without too many calories. Decrease your intake of foods high in solid fats, added sugars, and salt. Eat the right amount of calories for you.Get information about a proper diet from your health care provider, if necessary.  Regular physical exercise is one of the most important things you can do for your health. Most adults should get at least 150 minutes of moderate-intensity exercise (any activity that increases your heart rate and causes you to sweat) each week. In addition, most adults need muscle-strengthening exercises on 2 or more days a week.  Maintain a healthy weight. The body mass index (BMI) is a screening tool to identify possible weight problems. It provides an estimate of body fat based on height and weight. Your health care provider can find your BMI and can help you achieve or maintain a healthy weight.For adults 20 years and older:  A BMI below 18.5 is considered underweight.  A BMI of 18.5 to 24.9 is normal.  A BMI of 25 to 29.9 is considered overweight.  A  BMI of 30 and above is considered obese.  Maintain normal blood lipids and cholesterol levels by exercising and minimizing your intake of saturated fat. Eat a balanced diet with plenty of fruit and vegetables. Blood tests for lipids and cholesterol should begin at age 68 and be repeated every 5 years. If your lipid or cholesterol levels are high, you are over 50, or you are at high risk for heart disease, you may need your cholesterol levels checked more frequently.Ongoing high lipid and cholesterol levels should be treated with medicines if diet and exercise are not working.  If you smoke, find out from your health care provider how to quit. If you do not use tobacco, do not start.  Lung cancer screening is recommended for adults aged 56-80 years who are at high risk for developing lung cancer because of a history of smoking. A yearly low-dose CT scan of the lungs is recommended for people who have at least a 30-pack-year history of smoking and are a current smoker or have quit within the past 15 years. A pack year of smoking is smoking an average of 1 pack of cigarettes a day for 1 year (for example: 1 pack a day for 30 years or 2 packs a day for 15 years). Yearly screening should continue until the smoker has stopped smoking for at least 15 years. Yearly screening should be stopped for people who develop a health problem that would prevent them from having lung cancer treatment.  If you are pregnant, do not drink alcohol. If you are  breastfeeding, be very cautious about drinking alcohol. If you are not pregnant and choose to drink alcohol, do not have more than 1 drink per day. One drink is considered to be 12 ounces (355 mL) of beer, 5 ounces (148 mL) of wine, or 1.5 ounces (44 mL) of liquor.  Avoid use of street drugs. Do not share needles with anyone. Ask for help if you need support or instructions about stopping the use of drugs.  High blood pressure causes heart disease and increases the risk  of stroke. Your blood pressure should be checked at least every 1 to 2 years. Ongoing high blood pressure should be treated with medicines if weight loss and exercise do not work.  If you are 55-79 years old, ask your health care provider if you should take aspirin to prevent strokes.  Diabetes screening is done by taking a blood sample to check your blood glucose level after you have not eaten for a certain period of time (fasting). If you are not overweight and you do not have risk factors for diabetes, you should be screened once every 3 years starting at age 45. If you are overweight or obese and you are 40-70 years of age, you should be screened for diabetes every year as part of your cardiovascular risk assessment.  Breast cancer screening is essential preventive care for women. You should practice "breast self-awareness." This means understanding the normal appearance and feel of your breasts and may include breast self-examination. Any changes detected, no matter how small, should be reported to a health care provider. Women in their 20s and 30s should have a clinical breast exam (CBE) by a health care provider as part of a regular health exam every 1 to 3 years. After age 40, women should have a CBE every year. Starting at age 40, women should consider having a mammogram (breast X-ray test) every year. Women who have a family history of breast cancer should talk to their health care provider about genetic screening. Women at a high risk of breast cancer should talk to their health care providers about having an MRI and a mammogram every year.  Breast cancer gene (BRCA)-related cancer risk assessment is recommended for women who have family members with BRCA-related cancers. BRCA-related cancers include breast, ovarian, tubal, and peritoneal cancers. Having family members with these cancers may be associated with an increased risk for harmful changes (mutations) in the breast cancer genes BRCA1 and  BRCA2. Results of the assessment will determine the need for genetic counseling and BRCA1 and BRCA2 testing.  Your health care provider may recommend that you be screened regularly for cancer of the pelvic organs (ovaries, uterus, and vagina). This screening involves a pelvic examination, including checking for microscopic changes to the surface of your cervix (Pap test). You may be encouraged to have this screening done every 3 years, beginning at age 21.  For women ages 30-65, health care providers may recommend pelvic exams and Pap testing every 3 years, or they may recommend the Pap and pelvic exam, combined with testing for human papilloma virus (HPV), every 5 years. Some types of HPV increase your risk of cervical cancer. Testing for HPV may also be done on women of any age with unclear Pap test results.  Other health care providers may not recommend any screening for nonpregnant women who are considered low risk for pelvic cancer and who do not have symptoms. Ask your health care provider if a screening pelvic exam is right for   you.  If you have had past treatment for cervical cancer or a condition that could lead to cancer, you need Pap tests and screening for cancer for at least 20 years after your treatment. If Pap tests have been discontinued, your risk factors (such as having a new sexual partner) need to be reassessed to determine if screening should resume. Some women have medical problems that increase the chance of getting cervical cancer. In these cases, your health care provider may recommend more frequent screening and Pap tests.  Colorectal cancer can be detected and often prevented. Most routine colorectal cancer screening begins at the age of 14 years and continues through age 93 years. However, your health care provider may recommend screening at an earlier age if you have risk factors for colon cancer. On a yearly basis, your health care provider may provide home test kits to check  for hidden blood in the stool. Use of a small camera at the end of a tube, to directly examine the colon (sigmoidoscopy or colonoscopy), can detect the earliest forms of colorectal cancer. Talk to your health care provider about this at age 41, when routine screening begins. Direct exam of the colon should be repeated every 5-10 years through age 70 years, unless early forms of precancerous polyps or small growths are found.  People who are at an increased risk for hepatitis B should be screened for this virus. You are considered at high risk for hepatitis B if:  You were born in a country where hepatitis B occurs often. Talk with your health care provider about which countries are considered high risk.  Your parents were born in a high-risk country and you have not received a shot to protect against hepatitis B (hepatitis B vaccine).  You have HIV or AIDS.  You use needles to inject street drugs.  You live with, or have sex with, someone who has hepatitis B.  You get hemodialysis treatment.  You take certain medicines for conditions like cancer, organ transplantation, and autoimmune conditions.  Hepatitis C blood testing is recommended for all people born from [birth year] through 1965 and any individual with known risks for hepatitis C.  Practice safe sex. Use condoms and avoid high-risk sexual practices to reduce the spread of sexually transmitted infections (STIs). STIs include gonorrhea, chlamydia, syphilis, trichomonas, herpes, HPV, and human immunodeficiency virus (HIV). Herpes, HIV, and HPV are viral illnesses that have no cure. They can result in disability, cancer, and death.  You should be screened for sexually transmitted illnesses (STIs) including gonorrhea and chlamydia if:  You are sexually active and are younger than 24 years.  You are older than 24 years and your health care provider tells you that you are at risk for this type of infection.  Your sexual activity has changed  since you were last screened and you are at an increased risk for chlamydia or gonorrhea. Ask your health care provider if you are at risk.  If you are at risk of being infected with HIV, it is recommended that you take a prescription medicine daily to prevent HIV infection. This is called preexposure prophylaxis (PrEP). You are considered at risk if:  You are sexually active and do not regularly use condoms or know the HIV status of your partner(s).  You take drugs by injection.  You are sexually active with a partner who has HIV.  Talk with your health care provider about whether you are at high risk of being infected with HIV. If  you choose to begin PrEP, you should first be tested for HIV. You should then be tested every 3 months for as long as you are taking PrEP.  Osteoporosis is a disease in which the bones lose minerals and strength with aging. This can result in serious bone fractures or breaks. The risk of osteoporosis can be identified using a bone density scan. Women ages 67 years and over and women at risk for fractures or osteoporosis should discuss screening with their health care providers. Ask your health care provider whether you should take a calcium supplement or vitamin D to reduce the rate of osteoporosis.  Menopause can be associated with physical symptoms and risks. Hormone replacement therapy is available to decrease symptoms and risks. You should talk to your health care provider about whether hormone replacement therapy is right for you.  Use sunscreen. Apply sunscreen liberally and repeatedly throughout the day. You should seek shade when your shadow is shorter than you. Protect yourself by wearing long sleeves, pants, a wide-brimmed hat, and sunglasses year round, whenever you are outdoors.  Once a month, do a whole body skin exam, using a mirror to look at the skin on your back. Tell your health care provider of new moles, moles that have irregular borders, moles that  are larger than a pencil eraser, or moles that have changed in shape or color.  Stay current with required vaccines (immunizations).  Influenza vaccine. All adults should be immunized every year.  Tetanus, diphtheria, and acellular pertussis (Td, Tdap) vaccine. Pregnant women should receive 1 dose of Tdap vaccine during each pregnancy. The dose should be obtained regardless of the length of time since the last dose. Immunization is preferred during the 27th-36th week of gestation. An adult who has not previously received Tdap or who does not know her vaccine status should receive 1 dose of Tdap. This initial dose should be followed by tetanus and diphtheria toxoids (Td) booster doses every 10 years. Adults with an unknown or incomplete history of completing a 3-dose immunization series with Td-containing vaccines should begin or complete a primary immunization series including a Tdap dose. Adults should receive a Td booster every 10 years.  Varicella vaccine. An adult without evidence of immunity to varicella should receive 2 doses or a second dose if she has previously received 1 dose. Pregnant females who do not have evidence of immunity should receive the first dose after pregnancy. This first dose should be obtained before leaving the health care facility. The second dose should be obtained 4-8 weeks after the first dose.  Human papillomavirus (HPV) vaccine. Females aged 13-26 years who have not received the vaccine previously should obtain the 3-dose series. The vaccine is not recommended for use in pregnant females. However, pregnancy testing is not needed before receiving a dose. If a female is found to be pregnant after receiving a dose, no treatment is needed. In that case, the remaining doses should be delayed until after the pregnancy. Immunization is recommended for any person with an immunocompromised condition through the age of 61 years if she did not get any or all doses earlier. During the  3-dose series, the second dose should be obtained 4-8 weeks after the first dose. The third dose should be obtained 24 weeks after the first dose and 16 weeks after the second dose.  Zoster vaccine. One dose is recommended for adults aged 30 years or older unless certain conditions are present.  Measles, mumps, and rubella (MMR) vaccine. Adults born  before 1957 generally are considered immune to measles and mumps. Adults born in [birth year] or later should have 1 or more doses of MMR vaccine unless there is a contraindication to the vaccine or there is laboratory evidence of immunity to each of the three diseases. A routine second dose of MMR vaccine should be obtained at least 28 days after the first dose for students attending postsecondary schools, health care workers, or international travelers. People who received inactivated measles vaccine or an unknown type of measles vaccine during 1963-1967 should receive 2 doses of MMR vaccine. People who received inactivated mumps vaccine or an unknown type of mumps vaccine before 1979 and are at high risk for mumps infection should consider immunization with 2 doses of MMR vaccine. For females of childbearing age, rubella immunity should be determined. If there is no evidence of immunity, females who are not pregnant should be vaccinated. If there is no evidence of immunity, females who are pregnant should delay immunization until after pregnancy. Unvaccinated health care workers born before [birth year] who lack laboratory evidence of measles, mumps, or rubella immunity or laboratory confirmation of disease should consider measles and mumps immunization with 2 doses of MMR vaccine or rubella immunization with 1 dose of MMR vaccine.  Pneumococcal 13-valent conjugate (PCV13) vaccine. When indicated, a person who is uncertain of his immunization history and has no record of immunization should receive the PCV13 vaccine. All adults 68 years of age and older should receive this  vaccine. An adult aged 61 years or older who has certain medical conditions and has not been previously immunized should receive 1 dose of PCV13 vaccine. This PCV13 should be followed with a dose of pneumococcal polysaccharide (PPSV23) vaccine. Adults who are at high risk for pneumococcal disease should obtain the PPSV23 vaccine at least 8 weeks after the dose of PCV13 vaccine. Adults older than 48 years of age who have normal immune system function should obtain the PPSV23 vaccine dose at least 1 year after the dose of PCV13 vaccine.  Pneumococcal polysaccharide (PPSV23) vaccine. When PCV13 is also indicated, PCV13 should be obtained first. All adults aged 49 years and older should be immunized. An adult younger than age 55 years who has certain medical conditions should be immunized. Any person who resides in a nursing home or long-term care facility should be immunized. An adult smoker should be immunized. People with an immunocompromised condition and certain other conditions should receive both PCV13 and PPSV23 vaccines. People with human immunodeficiency virus (HIV) infection should be immunized as soon as possible after diagnosis. Immunization during chemotherapy or radiation therapy should be avoided. Routine use of PPSV23 vaccine is not recommended for American Indians, [place]atives, or people younger than 65 years unless there are medical conditions that require PPSV23 vaccine. When indicated, people who have unknown immunization and have no record of immunization should receive PPSV23 vaccine. One-time revaccination 5 years after the first dose of PPSV23 is recommended for people aged 19-64 years who have chronic kidney failure, nephrotic syndrome, asplenia, or immunocompromised conditions. People who received 1-2 doses of PPSV23 before age 62 years should receive another dose of PPSV23 vaccine at age 49 years or later if at least 5 years have passed since the previous dose. Doses of PPSV23 are not  needed for people immunized with PPSV23 at or after age 49 years.  Meningococcal vaccine. Adults with asplenia or persistent complement component deficiencies should receive 2 doses of quadrivalent meningococcal conjugate (MenACWY-D) vaccine. The doses should be obtained  at least 2 months apart. Microbiologists working with certain meningococcal bacteria, [place] recruits, people at risk during an outbreak, and people who travel to or live in countries with a high rate of meningitis should be immunized. A first-year college student up through age 34 years who is living in a residence hall should receive a dose if she did not receive a dose on or after her 16th birthday. Adults who have certain high-risk conditions should receive one or more doses of vaccine.  Hepatitis A vaccine. Adults who wish to be protected from this disease, have certain high-risk conditions, work with hepatitis A-infected animals, work in hepatitis A research labs, or travel to or work in countries with a high rate of hepatitis A should be immunized. Adults who were previously unvaccinated and who anticipate close contact with an international adoptee during the first 60 days after arrival in the [place] from a country with a high rate of hepatitis A should be immunized.  Hepatitis B vaccine. Adults who wish to be protected from this disease, have certain high-risk conditions, may be exposed to blood or other infectious body fluids, are household contacts or sex partners of hepatitis B positive people, are clients or workers in certain care facilities, or travel to or work in countries with a high rate of hepatitis B should be immunized.  Haemophilus influenzae type b (Hib) vaccine. A previously unvaccinated person with asplenia or sickle cell disease or having a scheduled splenectomy should receive 1 dose of Hib vaccine. Regardless of previous immunization, a recipient of a hematopoietic stem cell transplant should receive a  3-dose series 6-12 months after her successful transplant. Hib vaccine is not recommended for adults with HIV infection. Preventive Services / Frequency Ages 35 to 4 years  Blood pressure check.** / Every 3-5 years.  Lipid and cholesterol check.** / Every 5 years beginning at age 60.  Clinical breast exam.** / Every 3 years for women in their 71s and 10s.  BRCA-related cancer risk assessment.** / For women who have family members with a BRCA-related cancer (breast, ovarian, tubal, or peritoneal cancers).  Pap test.** / Every 2 years from ages 76 through 26. Every 3 years starting at age 61 through age 76 or 93 with a history of 3 consecutive normal Pap tests.  HPV screening.** / Every 3 years from ages 37 through ages 60 to 51 with a history of 3 consecutive normal Pap tests.  Hepatitis C blood test.** / For any individual with known risks for hepatitis C.  Skin self-exam. / Monthly.  Influenza vaccine. / Every year.  Tetanus, diphtheria, and acellular pertussis (Tdap, Td) vaccine.** / Consult your health care provider. Pregnant women should receive 1 dose of Tdap vaccine during each pregnancy. 1 dose of Td every 10 years.  Varicella vaccine.** / Consult your health care provider. Pregnant females who do not have evidence of immunity should receive the first dose after pregnancy.  HPV vaccine. / 3 doses over 6 months, if 93 and younger. The vaccine is not recommended for use in pregnant females. However, pregnancy testing is not needed before receiving a dose.  Measles, mumps, rubella (MMR) vaccine.** / You need at least 1 dose of MMR if you were born in 1957 or later. You may also need a 2nd dose. For females of childbearing age, rubella immunity should be determined. If there is no evidence of immunity, females who are not pregnant should be vaccinated. If there is no evidence of immunity, females who are  pregnant should delay immunization until after pregnancy.  Pneumococcal  13-valent conjugate (PCV13) vaccine.** / Consult your health care provider.  Pneumococcal polysaccharide (PPSV23) vaccine.** / 1 to 2 doses if you smoke cigarettes or if you have certain conditions.  Meningococcal vaccine.** / 1 dose if you are age 58 to 39 years and a Market researcher living in a residence hall, or have one of several medical conditions, you need to get vaccinated against meningococcal disease. You may also need additional booster doses.  Hepatitis A vaccine.** / Consult your health care provider.  Hepatitis B vaccine.** / Consult your health care provider.  Haemophilus influenzae type b (Hib) vaccine.** / Consult your health care provider. Ages 7 to 61 years  Blood pressure check.** / Every year.  Lipid and cholesterol check.** / Every 5 years beginning at age 29 years.  Lung cancer screening. / Every year if you are aged 21-80 years and have a 30-pack-year history of smoking and currently smoke or have quit within the past 15 years. Yearly screening is stopped once you have quit smoking for at least 15 years or develop a health problem that would prevent you from having lung cancer treatment.  Clinical breast exam.** / Every year after age 35 years.  BRCA-related cancer risk assessment.** / For women who have family members with a BRCA-related cancer (breast, ovarian, tubal, or peritoneal cancers).  Mammogram.** / Every year beginning at age 53 years and continuing for as long as you are in good health. Consult with your health care provider.  Pap test.** / Every 3 years starting at age 93 years through age 63 or 26 years with a history of 3 consecutive normal Pap tests.  HPV screening.** / Every 3 years from ages 45 years through ages 3 to 74 years with a history of 3 consecutive normal Pap tests.  Fecal occult blood test (FOBT) of stool. / Every year beginning at age 22 years and continuing until age 72 years. You may not need to do this test if you get  a colonoscopy every 10 years.  Flexible sigmoidoscopy or colonoscopy.** / Every 5 years for a flexible sigmoidoscopy or every 10 years for a colonoscopy beginning at age 67 years and continuing until age 6 years.  Hepatitis C blood test.** / For all people born from [birth year] through 1967 and any individual with known risks for hepatitis C.  Skin self-exam. / Monthly.  Influenza vaccine. / Every year.  Tetanus, diphtheria, and acellular pertussis (Tdap/Td) vaccine.** / Consult your health care provider. Pregnant women should receive 1 dose of Tdap vaccine during each pregnancy. 1 dose of Td every 10 years.  Varicella vaccine.** / Consult your health care provider. Pregnant females who do not have evidence of immunity should receive the first dose after pregnancy.  Zoster vaccine.** / 1 dose for adults aged 64 years or older.  Measles, mumps, rubella (MMR) vaccine.** / You need at least 1 dose of MMR if you were born in 1957 or later. You may also need a second dose. For females of childbearing age, rubella immunity should be determined. If there is no evidence of immunity, females who are not pregnant should be vaccinated. If there is no evidence of immunity, females who are pregnant should delay immunization until after pregnancy.  Pneumococcal 13-valent conjugate (PCV13) vaccine.** / Consult your health care provider.  Pneumococcal polysaccharide (PPSV23) vaccine.** / 1 to 2 doses if you smoke cigarettes or if you have certain conditions.  Meningococcal vaccine.** /  Consult your health care provider.  Hepatitis A vaccine.** / Consult your health care provider.  Hepatitis B vaccine.** / Consult your health care provider.  Haemophilus influenzae type b (Hib) vaccine.** / Consult your health care provider. Ages 90 years and over  Blood pressure check.** / Every year.  Lipid and cholesterol check.** / Every 5 years beginning at age 11 years.  Lung cancer screening. / Every year if you  are aged 38-80 years and have a 30-pack-year history of smoking and currently smoke or have quit within the past 15 years. Yearly screening is stopped once you have quit smoking for at least 15 years or develop a health problem that would prevent you from having lung cancer treatment.  Clinical breast exam.** / Every year after age 56 years.  BRCA-related cancer risk assessment.** / For women who have family members with a BRCA-related cancer (breast, ovarian, tubal, or peritoneal cancers).  Mammogram.** / Every year beginning at age 13 years and continuing for as long as you are in good health. Consult with your health care provider.  Pap test.** / Every 3 years starting at age 67 years through age 18 or 57 years with 3 consecutive normal Pap tests. Testing can be stopped between 65 and 70 years with 3 consecutive normal Pap tests and no abnormal Pap or HPV tests in the past 10 years.  HPV screening.** / Every 3 years from ages 66 years through ages 37 or 16 years with a history of 3 consecutive normal Pap tests. Testing can be stopped between 65 and 70 years with 3 consecutive normal Pap tests and no abnormal Pap or HPV tests in the past 10 years.  Fecal occult blood test (FOBT) of stool. / Every year beginning at age 65 years and continuing until age 44 years. You may not need to do this test if you get a colonoscopy every 10 years.  Flexible sigmoidoscopy or colonoscopy.** / Every 5 years for a flexible sigmoidoscopy or every 10 years for a colonoscopy beginning at age 55 years and continuing until age 69 years.  Hepatitis C blood test.** / For all people born from [birth year] through 1965 and any individual with known risks for hepatitis C.  Osteoporosis screening.** / A one-time screening for women ages 36 years and over and women at risk for fractures or osteoporosis.  Skin self-exam. / Monthly.  Influenza vaccine. / Every year.  Tetanus, diphtheria, and acellular pertussis (Tdap/Td)  vaccine.** / 1 dose of Td every 10 years.  Varicella vaccine.** / Consult your health care provider.  Zoster vaccine.** / 1 dose for adults aged 56 years or older.  Pneumococcal 13-valent conjugate (PCV13) vaccine.** / Consult your health care provider.  Pneumococcal polysaccharide (PPSV23) vaccine.** / 1 dose for all adults aged 15 years and older.  Meningococcal vaccine.** / Consult your health care provider.  Hepatitis A vaccine.** / Consult your health care provider.  Hepatitis B vaccine.** / Consult your health care provider.  Haemophilus influenzae type b (Hib) vaccine.** / Consult your health care provider. ** Family history and personal history of risk and conditions may change your health care provider's recommendations.   This information is not intended to replace advice given to you by your health care provider. Make sure you discuss any questions you have with your health care provider.   Document Released: 12/02/2001 Document Revised: 10/27/2014 Document Reviewed: 03/03/2011 Elsevier Interactive Patient Education [employer].

## 2016-07-04 NOTE — Progress Notes (Signed)
   Subjective:    Patient ID: Leah Cain is a 45 y.o. female presenting with Referral  on 07/04/2016  HPI: Patient has no problems. Has IUD in place x 13 years ago. Supposed to be a 5 year IUD. She required surgery to elevate her uterus due to prolapse. Reports they have been to many physicians and no one can remove her IUD. Review of records reveals that she had some form of cervical shortening process that involved vaginal removal of the cervix and re-epithelialization involving the cardinal ligaments. She has had Gen anesthesia and they could not get her IUD out. She has not had a menses since that time. She is worried about getting pregnant. Pelvic sono reveals uterus with IUD in cavity.  Review of Systems  Constitutional: Negative for chills and fever.  HENT: Negative for congestion, nosebleeds and rhinorrhea.   Eyes: Negative for visual disturbance.  Respiratory: Negative for chest tightness and shortness of breath.   Cardiovascular: Negative for chest pain.  Gastrointestinal: Negative for abdominal distention, abdominal pain, constipation, diarrhea, nausea and vomiting.  Genitourinary: Negative for dysuria, frequency, menstrual problem, pelvic pain and vaginal bleeding.  Musculoskeletal: Negative for arthralgias.  Skin: Negative for rash.  Neurological: Negative for dizziness and headaches.  Psychiatric/Behavioral: Negative for behavioral problems, dysphoric mood and sleep disturbance.  All other systems reviewed and are negative.     Objective:    BP 112/82   Pulse 77   Ht 5\' 1"  (1.549 m)   Wt 180 lb (81.6 kg)   BMI 34.01 kg/m  Physical Exam  Constitutional: She is oriented to person, place, and time. She appears well-developed and well-nourished. No distress.  HENT:  Head: Normocephalic and atraumatic.  Eyes: Pupils are equal, round, and reactive to light. No scleral icterus.  Neck: Normal range of motion. Neck supple. No thyromegaly present.  Cardiovascular: Normal  rate, regular rhythm and intact distal pulses.   Pulmonary/Chest: Effort normal and breath sounds normal. Right breast exhibits no inverted nipple, no mass and no nipple discharge. Left breast exhibits no inverted nipple, no mass and no nipple discharge.  Abdominal: Soft. She exhibits no distension. There is no tenderness.  Genitourinary: Vagina normal.  Genitourinary Comments: Uterus could be felt on bimanual but no cervix is seen in the vaginal vault. No adnexal mass or tenderness.  Neurological: She is alert and oriented to person, place, and time.  Skin: Skin is warm and dry.  Psychiatric: She has a normal mood and affect.        Assessment & Plan:   Problem List Items Addressed This Visit      Unprioritized   IUD retained ([place])    Unable to get out as there is no cervix to go through to try to remove it--additionally there is no cervix to perform TVH--would need TLH or TAH neither is a good option given that she is asymptomatic.       Other Visit Diagnoses    Screening for breast cancer    -  Primary   Relevant Orders   MM DIGITAL SCREENING BILATERAL   Screening for cervical cancer       Relevant Orders   Cytology - PAP      Total face-to-face time with patient: 25 minutes. Over 50% of encounter was spent on counseling and coordination of care.  Donnamae Jude 07/04/2016 8:44 AM

## 2016-07-04 NOTE — Assessment & Plan Note (Signed)
Unable to get out as there is no cervix to go through to try to remove it--additionally there is no cervix to perform TVH--would need TLH or TAH neither is a good option given that she is asymptomatic.

## 2016-07-07 LAB — CYTOLOGY - PAP

## 2016-07-30 ENCOUNTER — Ambulatory Visit (HOSPITAL_COMMUNITY)
Admission: RE | Admit: 2016-07-30 | Discharge: 2016-07-30 | Disposition: A | Discharge status: Home / Self Care | Payer: Self-pay | Source: Ambulatory Visit | Attending: Internal Medicine | Admitting: Internal Medicine

## 2016-07-30 ENCOUNTER — Ambulatory Visit (HOSPITAL_COMMUNITY)
Admission: RE | Admit: 2016-07-30 | Discharge: 2016-07-30 | Disposition: A | Discharge status: Home / Self Care | Payer: Self-pay | Source: Ambulatory Visit | Attending: Physical Medicine & Rehabilitation | Admitting: Physical Medicine & Rehabilitation

## 2016-07-30 DIAGNOSIS — M545 Low back pain, unspecified: Secondary | ICD-10-CM

## 2016-07-30 DIAGNOSIS — M25512 Pain in left shoulder: Secondary | ICD-10-CM

## 2016-07-31 ENCOUNTER — Encounter: Payer: Self-pay | Attending: Physical Medicine & Rehabilitation | Admitting: Registered Nurse

## 2016-07-31 ENCOUNTER — Encounter: Payer: Self-pay | Admitting: Registered Nurse

## 2016-07-31 VITALS — BP 116/76 | HR 81

## 2016-07-31 DIAGNOSIS — M25512 Pain in left shoulder: Secondary | ICD-10-CM

## 2016-07-31 DIAGNOSIS — M542 Cervicalgia: Secondary | ICD-10-CM | POA: Insufficient documentation

## 2016-07-31 DIAGNOSIS — M79642 Pain in left hand: Secondary | ICD-10-CM | POA: Insufficient documentation

## 2016-07-31 DIAGNOSIS — G894 Chronic pain syndrome: Secondary | ICD-10-CM

## 2016-07-31 DIAGNOSIS — G479 Sleep disorder, unspecified: Secondary | ICD-10-CM | POA: Insufficient documentation

## 2016-07-31 DIAGNOSIS — M5412 Radiculopathy, cervical region: Secondary | ICD-10-CM

## 2016-07-31 DIAGNOSIS — G8929 Other chronic pain: Secondary | ICD-10-CM

## 2016-07-31 MED ORDER — TRAMADOL HCL 50 MG PO TABS: 100.0000 mg | ORAL_TABLET | Freq: Four times a day (QID) | ORAL | 0 refills | Status: DC | PRN

## 2016-07-31 MED ORDER — GABAPENTIN 100 MG PO CAPS: 100.0000 mg | ORAL_CAPSULE | Freq: Three times a day (TID) | ORAL | 0 refills | Status: DC

## 2016-07-31 MED FILL — traMADol HCL 50 MG TABS: 50 | 30 days supply | Qty: 240 | Fill #0

## 2016-07-31 MED FILL — GABAPENTIN 100 MG CAPSULE: 100 | 30 days supply | Qty: 90 | Fill #0

## 2016-07-31 NOTE — Patient Instructions (Signed)
Start Gabapentin tonight: one capsule at bedtime   Only take at Bedtime for one week   On 08/07/16 If the Pain Persists Take one capsule in the morning and one capsule at bedtime for a week   On 08/13/2016 If the Pain Persists Take one Capsule in the Morning, Afternoon and Evening   If you have any questions please call office [phone]

## 2016-07-31 NOTE — Progress Notes (Signed)
Subjective:    Patient ID: Leah Cain, female    DOB: [birth year], 45 y.o.   MRN: [id number]  HPI: Leah Cain is a 45 year old female who returns for follow up appointment and medication refill. She states her pain is located in her neck radiating into her left shoulder and arm with tingling and numbness and right shoulder pain. She rates her pain 10. Her current exercises regime is walking.  Reviewed Left Shoulder X-ray: they verbalizes understanding.  LEFT SHOULDER - 2+ VIEW COMPARISON:  None. FINDINGS: There is no evidence of fracture or dislocation. There is no evidence of arthropathy or other focal bone abnormality. Soft tissues are unremarkable.  IMPRESSION: Negative.  Pain Inventory Average Pain 10 Pain Right Now 10 My pain is sharp and tingling  In the last 24 hours, has pain interfered with the following? General activity 0 Relation with others 0 Enjoyment of life 0 What TIME of day is your pain at its worst? n/a Sleep (in general) NA  Pain is worse with: inactivity Pain improves with: n/a Relief from Meds: 5  Mobility do you drive?  yes  Function Do you have any goals in this area?  no  Neuro/Psych No problems in this area  Prior Studies Any changes since last visit?  yes x-rays  Physicians involved in your care Any changes since last visit?  no   Family History  Problem Relation Age of Onset  . Stroke Father    Social History   Social History  . Marital status: Married    Spouse name: N/A  . Number of children: N/A  . Years of education: N/A   Social History Main Topics  . Smoking status: Never Smoker  . Smokeless tobacco: Never Used  . Alcohol use No  . Drug use: No  . Sexual activity: Not on file   Other Topics Concern  . Not on file   Social History Narrative  . No narrative on file   Past Surgical History:  Procedure Laterality Date  . cervical prolapse  2006   Past Medical History:  Diagnosis Date  . Eczema      There were no vitals taken for this visit.  Opioid Risk Score:   Fall Risk Score:  `1  Depression screen PHQ 2/9  Depression screen [facility] 2/9 06/04/2016 05/27/2016 03/18/2016 03/03/2016 11/07/2015 11/07/2015 10/26/2015  Decreased Interest 0 0 0 0 0 0 0  Down, Depressed, Hopeless 0 0 0 0 0 0 0  PHQ - 2 Score 0 0 0 0 0 0 0  Altered sleeping 0 - 0 - - - -  Tired, decreased energy 0 - 0 - - - -  Change in appetite 0 - 0 - - - -  Feeling bad or failure about yourself  0 - 0 - - - -  Trouble concentrating 0 - 0 - - - -  Moving slowly or fidgety/restless 0 - 0 - - - -  Suicidal thoughts 0 - 0 - - - -  PHQ-9 Score 0 - 0 - - - -  Difficult doing work/chores Not difficult at all - Not difficult at all - - - -    Review of Systems  All other systems reviewed and are negative.      Objective:   Physical Exam  Constitutional: She is oriented to person, place, and time. She appears well-developed and well-nourished.  HENT:  Head: Normocephalic and atraumatic.  Neck: Normal range of motion. Neck supple.  Cervical Paraspinal Tenderness: C-4- C-6  Cardiovascular: Normal rate and regular rhythm.   Pulmonary/Chest: Effort normal and breath sounds normal.  Musculoskeletal:  Normal Muscle Bulk and Muscle Testing Reveals: Upper Extremities: Full ROM and Muscle Strength 5/5 Bilateral AC Joint Tenderness Thoracic Hypersensitivity: T-1- T-4 Lower Extremities: Full ROM and Muscle Strength 5/5 Arises from Chair with ease Narrow Based gait  Neurological: She is alert and oriented to person, place, and time.  Skin: Skin is warm and dry.  Psychiatric: She has a normal mood and affect.  Nursing note and vitals reviewed.    Assessment & Plan:  1. Left shoulder pain: X-ray reviewed. Negative Refilled:  Tramadol to 100mg  Q6 hours #240. Continue Mobic and Robaxin 2. Left hand pain: Schedule for  NCS/EMG with Dr. Posey Pronto 3. Sleep disturbance: Continue Elavil to 25mg  at HS. 4. Cervical Radicular pain: RX:  Gabapentin: Instructions Given  30 minutes of face to face patient care time was spent during this visit. All questions were encouraged and answered.   F/U in 1 month with Dr. Posey Pronto

## 2016-08-01 ENCOUNTER — Ambulatory Visit: Payer: Self-pay | Admitting: Physical Medicine & Rehabilitation

## 2016-08-08 ENCOUNTER — Telehealth: Payer: Self-pay | Admitting: *Deleted

## 2016-08-08 NOTE — Telephone Encounter (Signed)
Tramadol is not helping and patient is crying.  Is there anything else that can be done?

## 2016-08-09 NOTE — Telephone Encounter (Signed)
She may increase her Robaxin to 1000mg  q4 PRN.  She may also increase Gabapenin to 300 TID.  Thanks.

## 2016-08-13 MED ORDER — METHOCARBAMOL 500 MG PO TABS: 1000.0000 mg | ORAL_TABLET | ORAL | 0 refills | Status: DC | PRN

## 2016-08-13 MED ORDER — GABAPENTIN 300 MG PO CAPS: 300.0000 mg | ORAL_CAPSULE | Freq: Three times a day (TID) | ORAL | 1 refills | Status: DC

## 2016-08-13 MED FILL — GABAPENTIN 300 MG CAPSULE: 300 | 30 days supply | Qty: 90 | Fill #0

## 2016-08-13 MED FILL — METHOCARBAMOL 500 MG TABLET: 500 | 30 days supply | Qty: 180 | Fill #0

## 2016-08-13 NOTE — Telephone Encounter (Signed)
I spoke again with Mr Leah Cain about the increase in the gabapentin and robaxin to give it a try at the higher dose and if the increased do not work they should call back but it needs to be given several days trial before they decide it is not working.

## 2016-08-13 NOTE — Telephone Encounter (Signed)
I sent the increase doses to the pharmacy and I spoke with Mr Leah Cain about [name]. Mr Leah Cain is saying that she has taken more of all the medication and nothing is helping her.  He is asking for a call back asap

## 2016-08-27 ENCOUNTER — Ambulatory Visit: Payer: Self-pay | Admitting: Physical Medicine & Rehabilitation

## 2016-08-28 ENCOUNTER — Other Ambulatory Visit: Payer: Self-pay | Admitting: Obstetrics and Gynecology

## 2016-08-28 ENCOUNTER — Ambulatory Visit: Payer: Self-pay | Admitting: Physical Medicine & Rehabilitation

## 2016-08-28 DIAGNOSIS — Z1231 Encounter for screening mammogram for malignant neoplasm of breast: Secondary | ICD-10-CM

## 2016-08-29 ENCOUNTER — Ambulatory Visit
Admission: RE | Admit: 2016-08-29 | Discharge: 2016-08-29 | Disposition: A | Discharge status: Home / Self Care | Payer: Self-pay | Source: Ambulatory Visit | Attending: Obstetrics and Gynecology | Admitting: Obstetrics and Gynecology

## 2016-08-29 ENCOUNTER — Ambulatory Visit: Payer: Self-pay | Admitting: Physical Medicine & Rehabilitation

## 2016-08-29 ENCOUNTER — Ambulatory Visit (HOSPITAL_COMMUNITY)
Admission: RE | Admit: 2016-08-29 | Discharge: 2016-08-29 | Disposition: A | Discharge status: Home / Self Care | Payer: Self-pay | Source: Ambulatory Visit | Attending: Obstetrics and Gynecology | Admitting: Obstetrics and Gynecology

## 2016-08-29 ENCOUNTER — Encounter (HOSPITAL_COMMUNITY): Payer: Self-pay

## 2016-08-29 VITALS — BP 118/84 | Temp 98.4°F | Ht 62.0 in | Wt 184.8 lb

## 2016-08-29 DIAGNOSIS — Z1239 Encounter for other screening for malignant neoplasm of breast: Secondary | ICD-10-CM

## 2016-08-29 DIAGNOSIS — Z1231 Encounter for screening mammogram for malignant neoplasm of breast: Secondary | ICD-10-CM

## 2016-08-29 NOTE — Patient Instructions (Signed)
Explained self breast awareness to [facility]. Patient did not need a Pap smear today due to last Pap smear and HPV typing was 07/04/2016. Let her know BCCCP will cover Pap smears and HPV typing every 5 years unless has a history of abnormal Pap smears. Referred patient to the [facility] for a screening mammogram. Appointment scheduled for Friday, August 29, 2016 at 1400. Let patient know the [facility] will follow up with her within the next couple weeks with results of mammogram by letter or phone. Leah Cain verbalized understanding.  [name], Arvil Chaco, RN 3:30 PM

## 2016-08-29 NOTE — Progress Notes (Signed)
No complaints today.   Pap Smear: Pap smear not completed today. Last Pap smear was 07/04/2016 at the [facility] for [place] [facility] and normal with negative HPV. Per patient has no history of an abnormal Pap smear. Last Pap smear result is in EPIC.  Physical exam: Breasts Breasts symmetrical. No skin abnormalities bilateral breasts. No nipple retraction bilateral breasts. No nipple discharge bilateral breasts. No lymphadenopathy. No lumps palpated bilateral breasts. No complaints of pain or tenderness on exam. Referred patient to the [place] for a screening mammogram. Appointment scheduled for Friday, August 29, 2016 at 1400.        Pelvic/Bimanual No Pap smear completed today since last Pap smear and HPV typing was 07/04/2016. Pap smear not indicated per BCCCP guidelines.   Smoking History: Patient has never smoked.  Patient Navigation: Patient education provided. Access to services provided for patient through [facility] program. Arabic interpreter provided.  Used Arabic interpreter Milbert Coulter from [facility].

## 2016-09-01 ENCOUNTER — Encounter (HOSPITAL_COMMUNITY): Payer: Self-pay | Admitting: *Deleted

## 2016-09-12 MED FILL — GABAPENTIN 300 MG CAPSULE: 300 | 30 days supply | Qty: 90 | Fill #1

## 2016-09-24 ENCOUNTER — Encounter: Payer: Self-pay | Admitting: Physical Medicine & Rehabilitation

## 2016-09-24 ENCOUNTER — Encounter: Payer: Medicaid Other | Attending: Physical Medicine & Rehabilitation | Admitting: Physical Medicine & Rehabilitation

## 2016-09-24 ENCOUNTER — Encounter (INDEPENDENT_AMBULATORY_CARE_PROVIDER_SITE_OTHER): Payer: Self-pay

## 2016-09-24 VITALS — BP 115/78 | HR 56 | Temp 97.7°F | Resp 14

## 2016-09-24 DIAGNOSIS — G479 Sleep disorder, unspecified: Secondary | ICD-10-CM | POA: Insufficient documentation

## 2016-09-24 DIAGNOSIS — M79602 Pain in left arm: Secondary | ICD-10-CM

## 2016-09-24 DIAGNOSIS — M25512 Pain in left shoulder: Secondary | ICD-10-CM | POA: Diagnosis not present

## 2016-09-24 DIAGNOSIS — G8929 Other chronic pain: Secondary | ICD-10-CM | POA: Insufficient documentation

## 2016-09-24 DIAGNOSIS — M79642 Pain in left hand: Secondary | ICD-10-CM | POA: Diagnosis not present

## 2016-09-24 DIAGNOSIS — M542 Cervicalgia: Secondary | ICD-10-CM | POA: Insufficient documentation

## 2016-09-24 DIAGNOSIS — M25521 Pain in right elbow: Secondary | ICD-10-CM

## 2016-09-24 MED ORDER — GABAPENTIN 300 MG PO CAPS: 300.0000 mg | ORAL_CAPSULE | Freq: Three times a day (TID) | ORAL | 2 refills | Status: DC

## 2016-09-24 MED ORDER — TRAMADOL HCL 50 MG PO TABS: 100.0000 mg | ORAL_TABLET | Freq: Four times a day (QID) | ORAL | 1 refills | Status: DC | PRN

## 2016-09-24 MED FILL — traMADol HCL 50 MG TABS: 50 | 30 days supply | Qty: 240 | Fill #0

## 2016-09-24 NOTE — Progress Notes (Signed)
Please see media tab 

## 2016-10-03 ENCOUNTER — Ambulatory Visit: Payer: No Typology Code available for payment source | Attending: Internal Medicine

## 2016-10-22 ENCOUNTER — Encounter: Payer: Medicaid Other | Admitting: Physical Medicine & Rehabilitation

## 2016-10-24 ENCOUNTER — Encounter: Payer: Self-pay | Admitting: Physical Medicine & Rehabilitation

## 2016-10-24 ENCOUNTER — Encounter: Payer: Medicaid Other | Attending: Physical Medicine & Rehabilitation | Admitting: Physical Medicine & Rehabilitation

## 2016-10-24 VITALS — BP 112/81 | HR 82 | Resp 14

## 2016-10-24 DIAGNOSIS — M25512 Pain in left shoulder: Secondary | ICD-10-CM | POA: Diagnosis not present

## 2016-10-24 DIAGNOSIS — M5412 Radiculopathy, cervical region: Secondary | ICD-10-CM

## 2016-10-24 DIAGNOSIS — G8929 Other chronic pain: Secondary | ICD-10-CM | POA: Diagnosis present

## 2016-10-24 DIAGNOSIS — M542 Cervicalgia: Secondary | ICD-10-CM | POA: Diagnosis not present

## 2016-10-24 DIAGNOSIS — G479 Sleep disorder, unspecified: Secondary | ICD-10-CM

## 2016-10-24 DIAGNOSIS — G894 Chronic pain syndrome: Secondary | ICD-10-CM

## 2016-10-24 DIAGNOSIS — M25521 Pain in right elbow: Secondary | ICD-10-CM

## 2016-10-24 DIAGNOSIS — M79642 Pain in left hand: Secondary | ICD-10-CM | POA: Diagnosis not present

## 2016-10-24 DIAGNOSIS — M79602 Pain in left arm: Secondary | ICD-10-CM

## 2016-10-24 MED ORDER — METHOCARBAMOL 500 MG PO TABS: 750.0000 mg | ORAL_TABLET | Freq: Four times a day (QID) | ORAL | 1 refills | Status: AC | PRN

## 2016-10-24 MED ORDER — DICLOFENAC SODIUM 1 % TD GEL: 2.0000 g | Freq: Four times a day (QID) | TRANSDERMAL | 8 refills | Status: DC

## 2016-10-24 MED ORDER — TRAMADOL HCL 50 MG PO TABS
50.0000 mg | ORAL_TABLET | Freq: Four times a day (QID) | ORAL | 1 refills | Status: DC | PRN
Start: 2016-10-24 — End: 2016-12-26

## 2016-10-24 MED ORDER — GABAPENTIN 300 MG PO CAPS: 300.0000 mg | ORAL_CAPSULE | Freq: Three times a day (TID) | ORAL | 2 refills | Status: DC

## 2016-10-24 MED FILL — DICLOFENAC SODIUM 1% GEL: 1 | 6 days supply | Qty: 100 | Fill #0

## 2016-10-24 MED FILL — METHOCARBAMOL 500 MG TABLET: 500 | 30 days supply | Qty: 180 | Fill #0

## 2016-10-24 NOTE — Progress Notes (Signed)
Subjective:    Patient ID: Leah Cain, female    DOB: Jun 21, 1971, 46 y.o.   MRN: [id number]  HPI  46 y/o female with no significant pmh of cervical neck pain presents for follow up of b/l cervical neck pain.   On initial visit, pt states, pain started 06/2015 when they were moving into a new house, getting progressively worse.  They were lifting a lot of heavy boxes, but she cannot identify an acute incident.  During the day it gets better, but worse overnight. Carrying objects exacerbates the pain as well as reaching overhead.  It is a sharp pain.  It radiates to her 1st 3 digits of her left hand.  Pain is constant.  She has tried IBU, Tylenol #3 without benefit.  She has associated numbness and weakness in the morning.  The pain inhibits her from activities of daily living and all enjoyable activities.  She has changed her mattress without any benefit.  She tends to sleep on her back and side.   Last clinic visit 07/31/16 by NP.  Husband preset, who provides all of the history. No falls since last visit.  She had an EMG on her last visit, which was relatively remarkable.  Overall, her pain has significantly improved with medications.  She is taking Gabapentin, Tramadol, Robaxin.  She is not taking Mobic or Elavil.  She has not obtained brace for her wrist.  She has not seen PT.   Pain Inventory Average Pain 7 Pain Right Now 3 My pain is intermittent  In the last 24 hours, has pain interfered with the following? General activity 6 Relation with others NA Enjoyment of life 8 What TIME of day is your pain at its worst? night Sleep (in general) Fair  Pain is worse with: NA Pain improves with: rest and pacing activities Relief from Meds: 6  Mobility walk without assistance ability to climb steps?  yes do you drive?  yes Do you have any goals in this area?  no  Function not employed: date last employed .  Neuro/Psych No problems in this area tingling  Prior Studies Any changes  since last visit?  no  Physicians involved in your care Any changes since last visit?  no   Family History  Problem Relation Age of Onset  . Stroke Father    Social History   Social History  . Marital status: Married    Spouse name: N/A  . Number of children: N/A  . Years of education: N/A   Social History Main Topics  . Smoking status: Never Smoker  . Smokeless tobacco: Never Used  . Alcohol use No  . Drug use: No  . Sexual activity: Not Asked   Other Topics Concern  . None   Social History Narrative  . None   Past Surgical History:  Procedure Laterality Date  . cervical prolapse  2006   Past Medical History:  Diagnosis Date  . Eczema    BP 112/81   Pulse 82   Resp 14   SpO2 97%   Opioid Risk Score:   Fall Risk Score:  `1  Depression screen PHQ 2/9  Depression screen [facility] 2/9 06/04/2016 05/27/2016 03/18/2016 03/03/2016 11/07/2015 11/07/2015 10/26/2015  Decreased Interest 0 0 0 0 0 0 0  Down, Depressed, Hopeless 0 0 0 0 0 0 0  PHQ - 2 Score 0 0 0 0 0 0 0  Altered sleeping 0 - 0 - - - -  Tired, decreased energy  0 - 0 - - - -  Change in appetite 0 - 0 - - - -  Feeling bad or failure about yourself  0 - 0 - - - -  Trouble concentrating 0 - 0 - - - -  Moving slowly or fidgety/restless 0 - 0 - - - -  Suicidal thoughts 0 - 0 - - - -  PHQ-9 Score 0 - 0 - - - -  Difficult doing work/chores Not difficult at all - Not difficult at all - - - -    Review of Systems  HENT: Negative.   Eyes: Negative.   Respiratory: Negative.   Cardiovascular: Negative.   Gastrointestinal: Negative.   Endocrine: Negative.   Genitourinary: Negative.   Musculoskeletal: Positive for arthralgias and myalgias.  Skin: Negative.   Allergic/Immunologic: Negative.   Neurological:       Tingling   Hematological: Negative.   Psychiatric/Behavioral: Negative.   All other systems reviewed and are negative.     Objective:   Physical Exam Gen: NAD. Vital signs reviewed HENT:  Normocephalic, Atraumatic Eyes: EOMI. No discharge.  Cardio: RRR. No JVD. Pulm: B/l clear to auscultation.  Effort normal Abd: Soft, BS+ MSK:  Gait WNL.   No TTP b/l UE  No edema.   Equivocal Speed's, Yergeson's, Hawkin's, empty can, lift off Neuro:   Sensation intact to light touch in all UE dermatomes  Reflexes 2+ throughout  Strength  4/5 in all RUE myotomes    4/5 in all LUE myotomes (pain inhibition) Skin: Warm and Dry. Intact.    Assessment & Plan:  46 y/o female with no significant pmh of cervical neck pain presents for follow up of b/l cervical neck pain.  1. Left shoulder pain  She was already referred to PT, pt to schedule appointment (still has not done so, husband states it is due to religion)  EMG/NCS relatively unremarkable  Pt not taking Mobic and Elavil  Tramadol to 50mg  Q6 hours (educated on signs symptoms of serotonin syndrome)  Will order MRI of shoulder as this appears to be more source of pain at present  Cont Robaxin 750 4/day PRN  Cont Voltaren gel  Strongly reminded to decrease excessive weight lifting as she does well and then tries to over exert herself and the pain returns - corroborated by husband   2. Left hand pain  Pt not taking Elavil 25mg   Cont Gabapentin   Pt has not follow up on bracing  Xray reviewed of shoulder- early degenerative changes C4-6  3. Sleep disturbance  Recent mattress change  Pt not taking Elavil 25mg    4. Cervical neck pain  Early DDD C4-C6  Will consider MRI in future if necessary

## 2016-10-27 MED FILL — traMADol HCL 50 MG TABS: 50 | 30 days supply | Qty: 240 | Fill #1

## 2016-12-26 ENCOUNTER — Encounter: Payer: Self-pay | Admitting: Physical Medicine & Rehabilitation

## 2016-12-26 ENCOUNTER — Encounter: Payer: Self-pay | Attending: Physical Medicine & Rehabilitation | Admitting: Physical Medicine & Rehabilitation

## 2016-12-26 VITALS — BP 116/78 | HR 82 | Resp 14

## 2016-12-26 DIAGNOSIS — M542 Cervicalgia: Secondary | ICD-10-CM

## 2016-12-26 DIAGNOSIS — G894 Chronic pain syndrome: Secondary | ICD-10-CM

## 2016-12-26 DIAGNOSIS — G5601 Carpal tunnel syndrome, right upper limb: Secondary | ICD-10-CM

## 2016-12-26 DIAGNOSIS — M79602 Pain in left arm: Secondary | ICD-10-CM

## 2016-12-26 DIAGNOSIS — G479 Sleep disorder, unspecified: Secondary | ICD-10-CM

## 2016-12-26 DIAGNOSIS — M5412 Radiculopathy, cervical region: Secondary | ICD-10-CM

## 2016-12-26 DIAGNOSIS — G8929 Other chronic pain: Secondary | ICD-10-CM

## 2016-12-26 DIAGNOSIS — M79642 Pain in left hand: Secondary | ICD-10-CM

## 2016-12-26 DIAGNOSIS — M25521 Pain in right elbow: Secondary | ICD-10-CM

## 2016-12-26 DIAGNOSIS — M25512 Pain in left shoulder: Secondary | ICD-10-CM

## 2016-12-26 MED ORDER — GABAPENTIN 300 MG PO CAPS: 300.0000 mg | ORAL_CAPSULE | Freq: Three times a day (TID) | ORAL | 1 refills | Status: DC

## 2016-12-26 MED ORDER — DULOXETINE HCL 30 MG PO CPEP
30.0000 mg | ORAL_CAPSULE | Freq: Every day | ORAL | 1 refills | Status: DC
Start: 2016-12-26 — End: 2016-12-26

## 2016-12-26 MED ORDER — TRAMADOL HCL 50 MG PO TABS: 50.0000 mg | ORAL_TABLET | Freq: Four times a day (QID) | ORAL | 1 refills | Status: DC | PRN

## 2016-12-26 MED ORDER — DULOXETINE HCL 30 MG PO CPEP
30.0000 mg | ORAL_CAPSULE | Freq: Every day | ORAL | 1 refills | Status: DC
Start: 2016-12-26 — End: 2017-02-04

## 2016-12-26 MED FILL — traMADol HCL 50 MG TABS: 50 | 30 days supply | Qty: 120 | Fill #0

## 2016-12-26 MED FILL — GABAPENTIN 300 MG CAPSULE: 300 | 30 days supply | Qty: 90 | Fill #0

## 2016-12-26 MED FILL — DULoxetine HCL 30 MG CPEP: 30 | 30 days supply | Qty: 30 | Fill #0

## 2016-12-26 NOTE — Progress Notes (Signed)
Subjective:    Patient ID: Leah Cain, female    DOB: Mar 11, 1971, 46 y.o.   MRN: [id number]  HPI  46 y/o female with no significant pmh of cervical neck pain presents for follow up of b/l cervical neck pain.   On initial visit, pt states, pain started 06/2015 when they were moving into a new house, getting progressively worse.  They were lifting a lot of heavy boxes, but she cannot identify an acute incident.  During the day it gets better, but worse overnight. Carrying objects exacerbates the pain as well as reaching overhead.  It is a sharp pain.  It radiates to her 1st 3 digits of her left hand.  Pain is constant.  She has tried IBU, Tylenol #3 without benefit.  She has associated numbness and weakness in the morning.  The pain inhibits her from activities of daily living and all enjoyable activities.  She has changed her mattress without any benefit.  She tends to sleep on her back and side.   Last clinic visit 10/24/16.  Husband provides all of history.  He states pt did not have PT because it was not covered.  She is still taking tramadol with benefit.  Robaxin and Voltaren gel, still provide benefit.   Last 3 weeks her neck has been bothering her more.  She has decreased the amount of heavy lifting she does.  This is her greatest source of pain.  Midline.    Pain Inventory Average Pain 7 Pain Right Now 0 My pain is intermittent, tingling and aching  In the last 24 hours, has pain interfered with the following? General activity 0 Relation with others 0 Enjoyment of life 0 What TIME of day is your pain at its worst? morning Sleep (in general) Fair  Pain is worse with: some activites Pain improves with: rest and medication Relief from Meds: 10  Mobility walk without assistance ability to climb steps?  yes do you drive?  yes  Function not employed: date last employed .  Neuro/Psych numbness tingling  Prior Studies Any changes since last visit?  no  Physicians involved in  your care Any changes since last visit?  no   Family History  Problem Relation Age of Onset  . Stroke Father    Social History   Social History  . Marital status: Married    Spouse name: N/A  . Number of children: N/A  . Years of education: N/A   Social History Main Topics  . Smoking status: Never Smoker  . Smokeless tobacco: Never Used  . Alcohol use No  . Drug use: No  . Sexual activity: Not Asked   Other Topics Concern  . None   Social History Narrative  . None   Past Surgical History:  Procedure Laterality Date  . cervical prolapse  2006   Past Medical History:  Diagnosis Date  . Eczema    BP 116/78   Pulse 82   Resp 14   SpO2 96%   Opioid Risk Score:   Fall Risk Score:  `1  Depression screen PHQ 2/9  Depression screen [facility] 2/9 12/26/2016 06/04/2016 05/27/2016 03/18/2016 03/03/2016 11/07/2015 11/07/2015  Decreased Interest 0 0 0 0 0 0 0  Down, Depressed, Hopeless 0 0 0 0 0 0 0  PHQ - 2 Score 0 0 0 0 0 0 0  Altered sleeping - 0 - 0 - - -  Tired, decreased energy - 0 - 0 - - -  Change in  appetite - 0 - 0 - - -  Feeling bad or failure about yourself  - 0 - 0 - - -  Trouble concentrating - 0 - 0 - - -  Moving slowly or fidgety/restless - 0 - 0 - - -  Suicidal thoughts - 0 - 0 - - -  PHQ-9 Score - 0 - 0 - - -  Difficult doing work/chores - Not difficult at all - Not difficult at all - - -    Review of Systems  Constitutional: Negative.   HENT: Negative.   Eyes: Negative.   Respiratory: Negative.   Cardiovascular: Negative.   Gastrointestinal: Negative.   Endocrine: Negative.   Genitourinary: Negative.   Musculoskeletal: Positive for arthralgias and myalgias.  Skin: Negative.   Allergic/Immunologic: Negative.   Neurological: Positive for numbness.       Tingling   Hematological: Negative.   Psychiatric/Behavioral: Negative.   All other systems reviewed and are negative.     Objective:   Physical Exam Gen: NAD. Vital signs reviewed HENT:  Normocephalic, Atraumatic Eyes: EOMI. No discharge.  Cardio: RRR. No JVD. Pulm: B/l clear to auscultation.  Effort normal Abd: Soft, BS+ MSK:  Gait WNL.   Mild TTP B/l mastoid process  No edema.   Equivocal Speed's, Yergeson's, Hawkin's, empty can, lift off Neuro:   Sensation intact to light touch in all UE dermatomes  Reflexes 2+ throughout  Strength  4+/5 in all RUE myotomes (pain inhibition)    4+/5 in all LUE myotomes (pain inhibition) Skin: Warm and Dry. Intact.    Assessment & Plan:  46 y/o female with no significant pmh of cervical neck pain presents for follow up of b/l cervical neck pain.  1. Left shoulder pain  She was already referred to PT, pt to schedule appointment (still has not done so, now states it is because they did not accept orange card)  EMG/NCS relatively unremarkable  Pt not taking Mobic and Elavil  Cont Tramadol to 50mg  Q6 hours (educated on signs symptoms of serotonin syndrome)  MRI of shoulder denied  Cont Robaxin 750 4/day PRN  Cont Voltaren gel  Will start Cymbalta 30, consider increase to 60   2. Right hand pain consistent with CTS  (Previsouly was left hand pain)  Pt not taking Elavil 25mg   Cont Gabapentin 300 TID  Will order CTS brace  Xray reviewed of shoulder- early degenerative changes C4-6  3. Sleep disturbance  Recent mattress change  Pt not taking Elavil 25mg    4. Cervical neck pain  Early DDD C4-C6  Will order MRI for transient generalized pain symptoms

## 2016-12-26 NOTE — Addendum Note (Signed)
Addended by: Delice Lesch A on: 12/26/2016 02:53 PM   Modules accepted: Orders

## 2017-01-07 ENCOUNTER — Encounter (HOSPITAL_COMMUNITY): Payer: Self-pay | Admitting: Emergency Medicine

## 2017-01-07 ENCOUNTER — Emergency Department (HOSPITAL_COMMUNITY)
Admission: EM | Admit: 2017-01-07 | Discharge: 2017-01-07 | Disposition: A | Discharge status: Home / Self Care | Payer: No Typology Code available for payment source | Attending: Emergency Medicine | Admitting: Emergency Medicine

## 2017-01-07 DIAGNOSIS — M79601 Pain in right arm: Secondary | ICD-10-CM | POA: Insufficient documentation

## 2017-01-07 DIAGNOSIS — G8929 Other chronic pain: Secondary | ICD-10-CM | POA: Insufficient documentation

## 2017-01-07 DIAGNOSIS — Z79899 Other long term (current) drug therapy: Secondary | ICD-10-CM | POA: Insufficient documentation

## 2017-01-07 DIAGNOSIS — M62830 Muscle spasm of back: Secondary | ICD-10-CM

## 2017-01-07 MED ORDER — METHOCARBAMOL 500 MG PO TABS: 500.0000 mg | ORAL_TABLET | Freq: Two times a day (BID) | ORAL | 0 refills | Status: DC

## 2017-01-07 MED ORDER — ACETAMINOPHEN 500 MG PO TABS
1000.0000 mg | ORAL_TABLET | Freq: Once | ORAL | Status: AC
  Administered 2017-01-07: 1000 mg via ORAL
  Filled 2017-01-07: qty 2

## 2017-01-07 MED ORDER — NAPROXEN 375 MG PO TABS: 375.0000 mg | ORAL_TABLET | Freq: Two times a day (BID) | ORAL | 0 refills | Status: DC

## 2017-01-07 MED ORDER — NAPROXEN 375 MG PO TABS
375.0000 mg | ORAL_TABLET | Freq: Once | ORAL | Status: AC
  Administered 2017-01-07: 375 mg via ORAL
  Filled 2017-01-07: qty 1

## 2017-01-07 MED ORDER — METHOCARBAMOL 500 MG PO TABS
500.0000 mg | ORAL_TABLET | Freq: Once | ORAL | Status: AC
  Administered 2017-01-07: 500 mg via ORAL
  Filled 2017-01-07: qty 1

## 2017-01-07 NOTE — ED Triage Notes (Signed)
Pt states she has had right arm pain for a year.  She has had x-rays done and they were unremarkable.  They state they also had acupuncture done with no relief.

## 2017-01-07 NOTE — ED Provider Notes (Signed)
[facility] DEPT Provider Note   CSN: [id number] Arrival date & time: 01/07/17  2142     History   Chief Complaint Chief Complaint  Patient presents with  . Arm Pain    HPI Leah Cain is a 46 y.o. female.  The history is provided by the patient and the spouse.  Arm Pain  This is a recurrent problem. The current episode started 2 days ago. The problem occurs constantly. The problem has not changed since onset.Pertinent negatives include no chest pain, no abdominal pain, no headaches and no shortness of breath. The symptoms are aggravated by twisting. Nothing relieves the symptoms. Treatments tried: tramadol, neurontin, Oxycodone. The treatment provided moderate relief.   Patient and husband report that this started approximately one year ago after moving to a new house. This pain has been intermittent since, exacerbated by heavy lifting.  Denies any recent trauma. No other physical complaints.  Past Medical History:  Diagnosis Date  . Eczema     Patient Active Problem List   Diagnosis Date Noted  . Cervical disc disorder with radiculopathy of cervical region 11/07/2015  . Chronic low back pain 06/15/2014  . Positive H. pylori test 12/26/2013  . IUD retained ([place]) 09/19/2013  . Postprandial epigastric pain 09/19/2013  . Postprandial RUQ pain 09/19/2013    Past Surgical History:  Procedure Laterality Date  . cervical prolapse  2006    OB History    Gravida Para Term Preterm AB Living   4 3     1      SAB TAB Ectopic Multiple Live Births   1       3       Home Medications    Prior to Admission medications   Medication Sig Start Date End Date Taking? Authorizing Provider  acetaminophen (TYLENOL) 500 MG tablet Take 500 mg by mouth every 6 (six) hours as needed for moderate pain.   Yes Historical Provider, MD  diclofenac sodium (VOLTAREN) 1 % GEL Apply 2 g topically 4 (four) times daily. Back of neck/shoulders 10/24/16  Yes Ankit Lorie Phenix, MD  DULoxetine  (CYMBALTA) 30 MG capsule Take 1 capsule (30 mg total) by mouth daily. 12/26/16  Yes Ankit Lorie Phenix, MD  gabapentin (NEURONTIN) 300 MG capsule Take 1 capsule (300 mg total) by mouth 3 (three) times daily. 12/26/16  Yes Ankit Lorie Phenix, MD  ibuprofen (ADVIL,MOTRIN) 100 MG/5ML suspension Take 200 mg by mouth every 6 (six) hours as needed for mild pain.   Yes Historical Provider, MD  naproxen sodium (ANAPROX) 220 MG tablet Take 440 mg by mouth 2 (two) times daily as needed (pain).   Yes Historical Provider, MD  traMADol (ULTRAM) 50 MG tablet Take 1 tablet (50 mg total) by mouth every 6 (six) hours as needed. Patient taking differently: Take 50 mg by mouth every 6 (six) hours as needed for severe pain.  12/26/16  Yes Ankit Lorie Phenix, MD  methocarbamol (ROBAXIN) 500 MG tablet Take 1 tablet (500 mg total) by mouth 2 (two) times daily. 01/07/17   Fatima Blank, MD  naproxen (NAPROSYN) 375 MG tablet Take 1 tablet (375 mg total) by mouth 2 (two) times daily. 01/07/17   Fatima Blank, MD    Family History Family History  Problem Relation Age of Onset  . Stroke Father     Social History Social History  Substance Use Topics  . Smoking status: Never Smoker  . Smokeless tobacco: Never Used  . Alcohol use No  Allergies   Patient has no known allergies.   Review of Systems Review of Systems  Respiratory: Negative for shortness of breath.   Cardiovascular: Negative for chest pain.  Gastrointestinal: Negative for abdominal pain.  Neurological: Negative for headaches.   Ten systems are reviewed and are negative for acute change except as noted in the HPI   Physical Exam Updated Vital Signs BP 125/79 (BP Location: Left Arm)   Pulse 60   Temp 98.1 F (36.7 C) (Oral)   Resp 20   Ht 5\' 6"  (1.676 m)   Wt 170 lb (77.1 kg)   SpO2 96%   BMI 27.44 kg/m   Physical Exam  Constitutional: She is oriented to person, place, and time. She appears well-developed and well-nourished. No  distress.  HENT:  Head: Normocephalic and atraumatic.  Right Ear: External ear normal.  Left Ear: External ear normal.  Nose: Nose normal.  Eyes: Conjunctivae and EOM are normal. No scleral icterus.  Neck: Normal range of motion and phonation normal.  Cardiovascular: Normal rate and regular rhythm.   Pulses:      Radial pulses are 2+ on the right side, and 2+ on the left side.  Pulmonary/Chest: Effort normal. No stridor. No respiratory distress.  Abdominal: She exhibits no distension.  Musculoskeletal: Normal range of motion. She exhibits no edema.       Cervical back: She exhibits tenderness and spasm.       Back:  Negative Spurling's  Neurological: She is alert and oriented to person, place, and time.  Skin: She is not diaphoretic.  Psychiatric: She has a normal mood and affect. Her behavior is normal.  Vitals reviewed.    ED Treatments / Results  Labs (all labs ordered are listed, but only abnormal results are displayed) Labs Reviewed - No data to display  EKG  EKG Interpretation None       Radiology No results found.  Procedures Procedures (including critical care time)  Medications Ordered in ED Medications  acetaminophen (TYLENOL) tablet 1,000 mg (not administered)  methocarbamol (ROBAXIN) tablet 500 mg (not administered)  naproxen (NAPROSYN) tablet 375 mg (not administered)     Initial Impression / Assessment and Plan / ED Course  I have reviewed the triage vital signs and the nursing notes.  Pertinent labs & imaging results that were available during my care of the patient were reviewed by me and considered in my medical decision making (see chart for details).     Consistent with muscle strain/spasm of the right upper. Low suspicion for radiculopathy or trauma. Low suspicion for DVT. No indication for advanced imaging at this time. Will treat symptomatically.  The patient is safe for discharge with strict return precautions.   Final Clinical  Impressions(s) / ED Diagnoses   Final diagnoses:  Muscle spasm of back   Disposition: Discharge  Condition: Good  I have discussed the results, Dx and Tx plan with the patient and husband who expressed understanding and agree(s) with the plan. Discharge instructions discussed at great length. The patient  and husband were given strict return precautions who verbalized understanding of the instructions. No further questions at time of discharge.    New Prescriptions   METHOCARBAMOL (ROBAXIN) 500 MG TABLET    Take 1 tablet (500 mg total) by mouth 2 (two) times daily.   NAPROXEN (NAPROSYN) 375 MG TABLET    Take 1 tablet (375 mg total) by mouth 2 (two) times daily.    Follow Up: Maren Reamer, MD  [place] [address] [phone]  Schedule an appointment as soon as possible for a visit  in 5-7 days, If symptoms do not improve or  worsen      Fatima Blank, MD 01/07/17 [phone]

## 2017-01-07 NOTE — ED Notes (Signed)
Pt refused to wait for vital signs

## 2017-01-23 MED FILL — traMADol HCL 50 MG TABS: 50 | 30 days supply | Qty: 120 | Fill #1

## 2017-02-02 MED FILL — GABAPENTIN 300 MG CAPSULE: 300 | 30 days supply | Qty: 90 | Fill #1

## 2017-02-04 ENCOUNTER — Encounter: Payer: Medicaid Other | Attending: Physical Medicine & Rehabilitation | Admitting: Physical Medicine & Rehabilitation

## 2017-02-04 ENCOUNTER — Encounter: Payer: Self-pay | Admitting: Physical Medicine & Rehabilitation

## 2017-02-04 VITALS — BP 111/78 | HR 88

## 2017-02-04 DIAGNOSIS — M79642 Pain in left hand: Secondary | ICD-10-CM | POA: Insufficient documentation

## 2017-02-04 DIAGNOSIS — M542 Cervicalgia: Secondary | ICD-10-CM | POA: Insufficient documentation

## 2017-02-04 DIAGNOSIS — G894 Chronic pain syndrome: Secondary | ICD-10-CM

## 2017-02-04 DIAGNOSIS — M25512 Pain in left shoulder: Secondary | ICD-10-CM | POA: Diagnosis not present

## 2017-02-04 DIAGNOSIS — G8929 Other chronic pain: Secondary | ICD-10-CM | POA: Diagnosis present

## 2017-02-04 DIAGNOSIS — M75121 Complete rotator cuff tear or rupture of right shoulder, not specified as traumatic: Secondary | ICD-10-CM

## 2017-02-04 DIAGNOSIS — G5601 Carpal tunnel syndrome, right upper limb: Secondary | ICD-10-CM

## 2017-02-04 DIAGNOSIS — G479 Sleep disorder, unspecified: Secondary | ICD-10-CM | POA: Diagnosis not present

## 2017-02-04 DIAGNOSIS — M25521 Pain in right elbow: Secondary | ICD-10-CM

## 2017-02-04 DIAGNOSIS — M79602 Pain in left arm: Secondary | ICD-10-CM

## 2017-02-04 DIAGNOSIS — Z5181 Encounter for therapeutic drug level monitoring: Secondary | ICD-10-CM

## 2017-02-04 MED ORDER — OXYCODONE HCL 5 MG PO TABS: 5.0000 mg | ORAL_TABLET | ORAL | 0 refills | Status: DC | PRN

## 2017-02-04 MED ORDER — GABAPENTIN 300 MG PO CAPS: 600.0000 mg | ORAL_CAPSULE | Freq: Three times a day (TID) | ORAL | 1 refills | Status: DC

## 2017-02-04 MED ORDER — TRAMADOL HCL 50 MG PO TABS: 50.0000 mg | ORAL_TABLET | Freq: Four times a day (QID) | ORAL | 1 refills | Status: DC | PRN

## 2017-02-04 MED ORDER — DULOXETINE HCL 60 MG PO CPEP: 60.0000 mg | ORAL_CAPSULE | Freq: Every day | ORAL | 1 refills | Status: DC

## 2017-02-04 NOTE — Progress Notes (Addendum)
Subjective:    Patient ID: Leah Cain, female    DOB: 09/05/1971, 46 y.o.   MRN: [id number]  HPI  46 y/o female with no significant pmh of cervical neck pain presents for follow up of b/l cervical neck pain.   On initial visit, pt states, pain started 06/2015 when they were moving into a new house, getting progressively worse.  They were lifting a lot of heavy boxes, but she cannot identify an acute incident.  During the day it gets better, but worse overnight. Carrying objects exacerbates the pain as well as reaching overhead.  It is a sharp pain.  It radiates to her 1st 3 digits of her left hand.  Pain is constant.  She has tried IBU, Tylenol #3 without benefit.  She has associated numbness and weakness in the morning.  The pain inhibits her from activities of daily living and all enjoyable activities.  She has changed her mattress without any benefit.  She tends to sleep on her back and side.   Last clinic visit 12/26/16.  Husband provides all of history.  Since last visit, "a lot of things have happened".  She went to the ED for arm pain. An MRI of her shoulder was done, which showed torn rotator cuff.  Husband believes there is some association with eating, suggesting pain with intensifies when she eats, but resolves when she does not eat.  Husband now states that she never received a call from PT, before he stated, she does not like to be touched by men.  She is taking Tramadol, Gabapenin, and Robaxin and Cymbalta. She is using voltaren gel as well.  She is taking Elavil at night.  She has been fitted for a brace, but she has not obtained it yet.  She hs not had an MRI of her neck yet. Poor historians as different times suggest different treatments.  Husband reiterates on several occasions the pain is so severe.   Pain Inventory Average Pain 7 Pain Right Now 5 My pain is intermittent, tingling and aching  In the last 24 hours, has pain interfered with the following? General activity  5 Relation with others 0 Enjoyment of life 0 What TIME of day is your pain at its worst? night Sleep (in general) Fair  Pain is worse with: some activites Pain improves with: rest and medication Relief from Meds: 8  Mobility walk without assistance ability to climb steps?  yes do you drive?  yes  Function not employed: date last employed .  Neuro/Psych numbness tingling  Prior Studies Any changes since last visit?  yes  ED visit  Physicians involved in your care Any changes since last visit?  no   Family History  Problem Relation Age of Onset  . Stroke Father    Social History   Social History  . Marital status: Married    Spouse name: Leah Cain  . Number of children: Leah Cain  . Years of education: Leah Cain   Social History Main Topics  . Smoking status: Never Smoker  . Smokeless tobacco: Never Used  . Alcohol use No  . Drug use: No  . Sexual activity: Not Asked   Other Topics Concern  . None   Social History Narrative  . None   Past Surgical History:  Procedure Laterality Date  . cervical prolapse  2006   Past Medical History:  Diagnosis Date  . Eczema    BP 111/78   Pulse 88   SpO2 96%   Opioid  Risk Score:   Fall Risk Score:  `1  Depression screen PHQ 2/9  Depression screen [facility] 2/9 02/04/2017 12/26/2016 06/04/2016 05/27/2016 03/18/2016 03/03/2016 11/07/2015  Decreased Interest 0 0 0 0 0 0 0  Down, Depressed, Hopeless 0 0 0 0 0 0 0  PHQ - 2 Score 0 0 0 0 0 0 0  Altered sleeping - - 0 - 0 - -  Tired, decreased energy - - 0 - 0 - -  Change in appetite - - 0 - 0 - -  Feeling bad or failure about yourself  - - 0 - 0 - -  Trouble concentrating - - 0 - 0 - -  Moving slowly or fidgety/restless - - 0 - 0 - -  Suicidal thoughts - - 0 - 0 - -  PHQ-9 Score - - 0 - 0 - -  Difficult doing work/chores - - Not difficult at all - Not difficult at all - -    Review of Systems  Constitutional: Negative.   HENT: Negative.   Eyes: Negative.   Respiratory: Negative.    Cardiovascular: Negative.   Gastrointestinal: Negative.   Endocrine: Negative.   Genitourinary: Negative.   Musculoskeletal: Positive for arthralgias and myalgias.  Skin: Negative.   Allergic/Immunologic: Negative.   Neurological: Positive for numbness.       Tingling   Hematological: Negative.   Psychiatric/Behavioral: Negative.   All other systems reviewed and are negative.     Objective:   Physical Exam Gen: NAD. Vital signs reviewed HENT: Normocephalic, Atraumatic Eyes: EOMI. No discharge.  Cardio: RRR. No JVD. Pulm: B/l clear to auscultation.  Effort normal Abd: Soft, BS+ MSK:  Gait WNL.   No edema.   +Speed's, Yergeson's, Hawkin's, empty can, lift off Neuro:   Sensation intact to light touch in all UE dermatomes  Strength  4+/5 in all RUE myotomes (pain inhibition)    4+/5 in all LUE myotomes (stronger then right) (pain inhibition) Skin: Warm and Dry. Intact.    Assessment & Plan:  46 y/o female with no significant pmh of cervical neck pain presents for follow up of b/l cervical neck pain.  1. Left shoulder pain due to rotator cuff tear  She was already referred to PT, pt to schedule appointment (still has not done so, now states it is because they did not accept orange card, previously stated does not want to be touched by men, now states they never received a call).  Stressed again  EMG/NCS relatively unremarkable  ?Pt not taking Mobic and Elavil  Cont Tramadol to 50mg  Q6 hours (educated on signs symptoms of serotonin syndrome)  MRI reviewed, showing torn rotator cuff muscles  Cont Robaxin 750 4/day PRN  Cont Voltaren gel  Will increase Cymbalta to 60 mg  Will prescribe Oxy 5 daily PRN - educated pt on this being emergency  Will schedule for shoulder injection  Contract signed  UDS performed   2. Right hand pain consistent with CTS  (Previsouly was left hand pain)  ?Pt not taking Elavil 25mg , encouraged to take  Gabapentin increased to 600 TID  Awaiting  CTS brace  Xray reviewed of shoulder- early degenerative changes C4-6  3. Sleep disturbance  Recent mattress change  ?Pt not taking Elavil 25mg    4. Cervical neck pain  Early DDD C4-C6  Will order MRI for transient generalized pain symptoms, pt states she never had

## 2017-02-04 NOTE — Addendum Note (Signed)
Addended by: Marland Mcalpine B on: 02/04/2017 01:13 PM   Modules accepted: Orders

## 2017-02-09 LAB — TOXASSURE SELECT,+ANTIDEPR,UR

## 2017-02-11 ENCOUNTER — Telehealth: Payer: Self-pay | Admitting: *Deleted

## 2017-02-11 NOTE — Telephone Encounter (Signed)
Urine drug screen does not show any medication. It is appropriate that there is no oxycodone since it was just written on the day of the test.  We do not count non CII medications so I can not say if she was out of tramadol at appt.  Per [place] her last fill was 01/23/17 so it should have been present. Her 'husband speaks for her and reports she is taking her medications".

## 2017-02-20 ENCOUNTER — Encounter: Payer: Medicaid Other | Attending: Physical Medicine & Rehabilitation | Admitting: Physical Medicine & Rehabilitation

## 2017-02-20 ENCOUNTER — Encounter: Payer: Self-pay | Admitting: Physical Medicine & Rehabilitation

## 2017-02-20 VITALS — BP 111/79 | HR 90

## 2017-02-20 DIAGNOSIS — M542 Cervicalgia: Secondary | ICD-10-CM | POA: Diagnosis not present

## 2017-02-20 DIAGNOSIS — G479 Sleep disorder, unspecified: Secondary | ICD-10-CM | POA: Diagnosis not present

## 2017-02-20 DIAGNOSIS — M25512 Pain in left shoulder: Secondary | ICD-10-CM | POA: Insufficient documentation

## 2017-02-20 DIAGNOSIS — M79642 Pain in left hand: Secondary | ICD-10-CM | POA: Diagnosis not present

## 2017-02-20 DIAGNOSIS — G8929 Other chronic pain: Secondary | ICD-10-CM | POA: Diagnosis present

## 2017-02-20 DIAGNOSIS — M7551 Bursitis of right shoulder: Secondary | ICD-10-CM

## 2017-02-20 NOTE — Addendum Note (Signed)
Addended by: Delice Lesch A on: 02/20/2017 03:42 PM   Modules accepted: Orders

## 2017-02-20 NOTE — Progress Notes (Signed)
Shoulder injection   Indication: Shoulder pain not relieved by medication management and other conservative care.  Informed consent was obtained after describing risks and benefits of the procedure with the patient, this includes bleeding, bruising, infection and medication side effects. The patient wishes to proceed and has given written consent. Patient was placed in a seated position. The right shoulder was marked and prepped with betadine in the subacromial area. Vapocoolant spray was applied. A 25-gauge 1-1/2 inch needle was inserted into the subacromial area. After negative draw back for blood, a solution containing 1 mL of 6 mg per ML celestone and 4 mL of 1% lidocaine was injected. A band aid was applied. The patient tolerated the procedure well. Post procedure instructions were given.  Instructed patient need to be on-time for appointment as patient consistently late.

## 2017-02-26 ENCOUNTER — Telehealth: Payer: Self-pay | Admitting: *Deleted

## 2017-02-26 LAB — TOXASSURE SELECT,+ANTIDEPR,UR

## 2017-02-26 NOTE — Telephone Encounter (Signed)
Urine drug screen for this encounter is inconsistent for prescribed medication. There are no metabolites of oxycodone and tramadol and no parent drug either.

## 2017-03-05 ENCOUNTER — Encounter: Payer: Self-pay | Admitting: Internal Medicine

## 2017-03-06 ENCOUNTER — Encounter: Payer: Self-pay | Admitting: Internal Medicine

## 2017-03-06 ENCOUNTER — Encounter: Payer: Medicaid Other | Admitting: Physical Medicine & Rehabilitation

## 2017-03-06 ENCOUNTER — Telehealth: Payer: Self-pay | Admitting: *Deleted

## 2017-03-06 NOTE — Telephone Encounter (Signed)
Patient's husband called at 3:20 to say they weren't going to make appointment.  I had explained to them that they were already a no show for Dr. Posey Pronto.  He said he would call back to reschedule.

## 2017-03-06 NOTE — Telephone Encounter (Signed)
I spoke with Mr  Arelia Sneddon (husband) at 92 noon and asked him to be sure to bring Leah Cain's medications to the appointment.  He said that they were out of town and were going to be late.  I told him he would need to reschedule. He said that he would but when he spoke with [place] he decided they would try and make appointment.  She reminded him to bring medications to appointment as well.

## 2017-03-09 ENCOUNTER — Encounter: Payer: Self-pay | Admitting: Internal Medicine

## 2017-03-23 MED FILL — GABAPENTIN 300 MG CAPSULE: 300 | 30 days supply | Qty: 180 | Fill #0

## 2018-02-20 ENCOUNTER — Encounter (HOSPITAL_COMMUNITY): Payer: Self-pay | Admitting: Family Medicine

## 2018-02-20 ENCOUNTER — Ambulatory Visit (HOSPITAL_COMMUNITY)
Admission: EM | Admit: 2018-02-20 | Discharge: 2018-02-20 | Disposition: A | Discharge status: Home / Self Care | Payer: No Typology Code available for payment source | Attending: Family Medicine | Admitting: Family Medicine

## 2018-02-20 DIAGNOSIS — M5412 Radiculopathy, cervical region: Secondary | ICD-10-CM

## 2018-02-20 MED ORDER — TRAMADOL HCL 50 MG PO TABS: 50.0000 mg | ORAL_TABLET | Freq: Four times a day (QID) | ORAL | 1 refills | Status: DC | PRN

## 2018-02-20 MED ORDER — PREDNISONE 20 MG PO TABS: ORAL_TABLET | ORAL | 0 refills | Status: DC

## 2018-02-20 NOTE — Discharge Instructions (Addendum)
Keep your appointment with your primary care provider

## 2018-02-20 NOTE — ED Provider Notes (Signed)
[place]   [id number] 02/20/18 Arrival Time: 7124   SUBJECTIVE:  Leah Cain is a 47 y.o. female who presents to the urgent care with complaint of right upper extremity pain. She has been pain-free for about a year but beginning about 3 days ago she developed excruciating pain rating down her right arm.  The pain affects her thumb, pointer finger, and middle finger.  She is tried Aleve and ibuprofen and a muscle relaxer none of them have helped.  She has an appointment in 3 weeks with [facility] for follow-up.  Note from 12/26/2016 47 y/o female with no significant pmh of cervical neck pain presents for follow up of b/l cervical neck pain.   On initial visit, pt states, pain started 06/2015 when they were moving into a new house, getting progressively worse.  They were lifting a lot of heavy boxes, but she cannot identify an acute incident.  During the day it gets better, but worse overnight. Carrying objects exacerbates the pain as well as reaching overhead.  It is a sharp pain.  It radiates to her 1st 3 digits of her left hand.  Pain is constant.  She has tried IBU, Tylenol #3 without benefit.  She has associated numbness and weakness in the morning.  The pain inhibits her from activities of daily living and all enjoyable activities.  She has changed her mattress without any benefit.  She tends to sleep on her back and side.   Last clinic visit 10/24/16.  Husband provides all of history.  He states pt did not have PT because it was not covered.  She is still taking tramadol with benefit.  Robaxin and Voltaren gel, still provide benefit.   Last 3 weeks her neck has been bothering her more.  She has decreased the amount of heavy lifting she does.  This is her greatest source of pain.  Midline.     Past Medical History:  Diagnosis Date  . Eczema    Family History  Problem Relation Age of Onset  . Stroke Father    Social History   Socioeconomic History  .  Marital status: Married    Spouse name: Not on file  . Number of children: Not on file  . Years of education: Not on file  . Highest education level: Not on file  Occupational History  . Not on file  Social Needs  . Financial resource strain: Not on file  . Food insecurity:    Worry: Not on file    Inability: Not on file  . Transportation needs:    Medical: Not on file    Non-medical: Not on file  Tobacco Use  . Smoking status: Never Smoker  . Smokeless tobacco: Never Used  Substance and Sexual Activity  . Alcohol use: No  . Drug use: No  . Sexual activity: Not on file  Lifestyle  . Physical activity:    Days per week: Not on file    Minutes per session: Not on file  . Stress: Not on file  Relationships  . Social connections:    Talks on phone: Not on file    Gets together: Not on file    Attends religious service: Not on file    Active member of club or organization: Not on file    Attends meetings of clubs or organizations: Not on file    Relationship status: Not on file  . Intimate partner violence:    Fear of current  or ex partner: Not on file    Emotionally abused: Not on file    Physically abused: Not on file    Forced sexual activity: Not on file  Other Topics Concern  . Not on file  Social History Narrative  . Not on file   No outpatient medications have been marked as taking for the 02/20/18 encounter [facility] Encounter).   No Known Allergies    ROS: As per HPI, remainder of ROS negative.   OBJECTIVE:   Vitals:   02/20/18 1510  BP: (!) 133/95  Pulse: 77  Resp: 16  Temp: 99 F (37.2 C)  TempSrc: Oral  SpO2: 99%     General appearance: alert; no distress Eyes: PERRL; EOMI; conjunctiva normal HENT: normocephalic; atraumatic;  oral mucosa normal Neck: supple Back: no CVA tenderness Extremities: no cyanosis or edema; symmetrical with no gross deformitie s Skin: warm and dry; eczematous changes of fingers of right hand Neurologic: normal  gait; grossly normal Psychological: alert and cooperative; normal mood and affect      Labs:  Results for orders placed or performed in visit on 02/20/17  ToxAssure Select,+Antidepr,UR  Result Value Ref Range   Summary FINAL     Labs Reviewed - No data to display  No results found.     ASSESSMENT & PLAN:  1. Cervical radiculopathy     Meds ordered this encounter  Medications  . traMADol (ULTRAM) 50 MG tablet    Sig: Take 1 tablet (50 mg total) by mouth every 6 (six) hours as needed.    Dispense:  120 tablet    Refill:  1  . predniSONE (DELTASONE) 20 MG tablet    Sig: Two daily with food    Dispense:  10 tablet    Refill:  0    Reviewed expectations re: course of current medical issues. Questions answered. Outlined signs and symptoms indicating need for more acute intervention. Patient verbalized understanding. After Visit Summary given.    Procedures:      Robyn Haber, MD 02/20/18 [phone]

## 2018-02-20 NOTE — ED Triage Notes (Signed)
Pt c/o R arm pain x1 year, worsening the last three days.

## 2018-04-18 ENCOUNTER — Encounter (HOSPITAL_COMMUNITY): Payer: Self-pay | Admitting: Emergency Medicine

## 2018-04-18 ENCOUNTER — Ambulatory Visit (HOSPITAL_COMMUNITY)
Admission: EM | Admit: 2018-04-18 | Discharge: 2018-04-18 | Disposition: A | Discharge status: Home / Self Care | Payer: Self-pay | Attending: Family Medicine | Admitting: Family Medicine

## 2018-04-18 DIAGNOSIS — G8929 Other chronic pain: Secondary | ICD-10-CM

## 2018-04-18 DIAGNOSIS — M79601 Pain in right arm: Secondary | ICD-10-CM

## 2018-04-18 DIAGNOSIS — M79602 Pain in left arm: Secondary | ICD-10-CM

## 2018-04-18 MED ORDER — MELOXICAM 15 MG PO TABS: 15.0000 mg | ORAL_TABLET | Freq: Every day | ORAL | 0 refills | Status: DC

## 2018-04-18 MED ORDER — HYDROCODONE-ACETAMINOPHEN 5-325 MG PO TABS
ORAL_TABLET | ORAL | Status: AC
  Filled 2018-04-18: qty 1

## 2018-04-18 MED ORDER — HYDROCODONE-ACETAMINOPHEN 5-325 MG PO TABS
1.0000 | ORAL_TABLET | Freq: Once | ORAL | Status: AC
Start: 2018-04-18 — End: 2018-04-18
  Administered 2018-04-18: 1 via ORAL

## 2018-04-18 NOTE — Discharge Instructions (Addendum)
Light regular activity as tolerated.  Meloxicam daily, take with food. Don't take additional ibuprofen or aleve.  Please establish with a primary care provider for long term pain management.

## 2018-04-18 NOTE — ED Provider Notes (Signed)
[place]    CSN: [id number] Arrival date & time: 04/18/18  1251     History   Chief Complaint Chief Complaint  Patient presents with  . Arthritis    HPI Leah Cain is a 47 y.o. female.   Ramya presents with her husband with complaints of bilateral arm pain which has been ongoing for the past 4 years. Has had numerous tests and treatments tried for this. Had been doing well for approximately 1 year, but over the past three months pain has returned and it worse again. Wakes with  Hand pain. During the day has bilateral upper arm pain. Had moved furniture in the home prior to increased pain. States that only tramadol, hydrocodone or oxycodone works for pain. Does not follow with a PCP. Per chart review had been seeing physical medicine and rehab but has not been in over a year. No specific change or new injury at this time.   ROS per HPI.      Past Medical History:  Diagnosis Date  . Eczema     Patient Active Problem List   Diagnosis Date Noted  . Cervical disc disorder with radiculopathy of cervical region 11/07/2015  . Chronic low back pain 06/15/2014  . Positive H. pylori test 12/26/2013  . IUD retained ([place]) 09/19/2013  . Postprandial epigastric pain 09/19/2013  . Postprandial RUQ pain 09/19/2013    Past Surgical History:  Procedure Laterality Date  . cervical prolapse  2006    OB History    Gravida  4   Para  3   Term      Preterm      AB  1   Living        SAB  1   TAB      Ectopic      Multiple      Live Births  3            Home Medications    Prior to Admission medications   Medication Sig Start Date End Date Taking? Authorizing Provider  acetaminophen (TYLENOL) 500 MG tablet Take 500 mg by mouth every 6 (six) hours as needed for moderate pain.    [provider]  DULoxetine (CYMBALTA) 60 MG capsule Take 1 capsule (60 mg total) by mouth daily. 02/04/17   Jamse Arn, MD  meloxicam (MOBIC) 15 MG  tablet Take 1 tablet (15 mg total) by mouth daily. 04/18/18   Zigmund Gottron, NP  predniSONE (DELTASONE) 20 MG tablet Two daily with food 02/20/18   Robyn Haber, MD  traMADol (ULTRAM) 50 MG tablet Take 1 tablet (50 mg total) by mouth every 6 (six) hours as needed. 02/20/18   Robyn Haber, MD    Family History Family History  Problem Relation Age of Onset  . Stroke Father     Social History Social History   Tobacco Use  . Smoking status: Never Smoker  . Smokeless tobacco: Never Used  Substance Use Topics  . Alcohol use: No  . Drug use: No     Allergies   Patient has no known allergies.   Review of Systems Review of Systems   Physical Exam Triage Vital Signs ED Triage Vitals [04/18/18 1422]  Enc Vitals Group     BP 128/76     Pulse Rate 67     Resp 16     Temp 98.5 F (36.9 C)     Temp Source Oral     SpO2 99 %  Weight      Height      Head Circumference      Peak Flow      Pain Score      Pain Loc      Pain Edu?      Excl. in [place]?    No data found.  Updated Vital Signs BP 128/76 (BP Location: Left Arm)   Pulse 67   Temp 98.5 F (36.9 C) (Oral)   Resp 16   SpO2 99%   Visual Acuity Right Eye Distance:   Left Eye Distance:   Bilateral Distance:    Right Eye Near:   Left Eye Near:    Bilateral Near:     Physical Exam  Constitutional: She is oriented to person, place, and time. She appears well-developed and well-nourished. No distress.  Cardiovascular: Normal rate, regular rhythm and normal heart sounds.  Pulmonary/Chest: Effort normal and breath sounds normal.  Musculoskeletal:  Full ROM to bilateral upper extremities, arc of shoulders, elbow and wrist ROM; gross sensation intact, strength equal bilaterally   Neurological: She is alert and oriented to person, place, and time.  Skin: Skin is warm and dry.     UC Treatments / Results  Labs (all labs ordered are listed, but only abnormal results are displayed) Labs Reviewed - No  data to display  EKG None  Radiology No results found.  Procedures Procedures (including critical care time)  Medications Ordered in UC Medications  HYDROcodone-acetaminophen (NORCO/VICODIN) 5-325 MG per tablet 1 tablet (has no administration in time range)    Initial Impression / Assessment and Plan / UC Course  I have reviewed the triage vital signs and the nursing notes.  Pertinent labs & imaging results that were available during my care of the patient were reviewed by me and considered in my medical decision making (see chart for details).     Patient husband quite adamant that patient needs tramadol filled at this time for this chronic pain. States she is taking meloxicam already however recall of medications and doses are inconsistent. Discussed that narcotics for chronic pain are not managed through urgent care and are not indicated for arthritic pain. meloxicam refilled today and encouraged use. To establish with PCP and/or pain management. Verbalized understanding.    Final Clinical Impressions(s) / UC Diagnoses   Final diagnoses:  Chronic pain of both upper extremities     Discharge Instructions     Light regular activity as tolerated.  Meloxicam daily, take with food. Don't take additional ibuprofen    ED Prescriptions    Medication Sig Dispense Auth. Provider   meloxicam (MOBIC) 15 MG tablet Take 1 tablet (15 mg total) by mouth daily. 30 tablet Zigmund Gottron, NP     Controlled Substance Prescriptions [place] Controlled Substance Registry consulted? Not Applicable   Zigmund Gottron, NP 04/18/18 1504

## 2018-04-18 NOTE — ED Notes (Signed)
Patient/spouse has questions.

## 2018-04-18 NOTE — ED Triage Notes (Signed)
Pt c/o bilateral arm pain x2 years, told it was arthritis.

## 2018-04-19 MED FILL — oxyCODONE HCL 10 MG TABS: 10 | 5 days supply | Qty: 30 | Fill #0

## 2018-04-21 MED FILL — GABAPENTIN 300 MG CAPSULE: 300 | 30 days supply | Qty: 90 | Fill #0

## 2018-04-21 MED FILL — MELOXICAM 15 MG TABLET: 15 | 30 days supply | Qty: 30 | Fill #0

## 2018-04-21 MED FILL — predniSONE 20 MG TABS: 20 | 9 days supply | Qty: 18 | Fill #0

## 2018-05-21 MED FILL — GABAPENTIN 300 MG CAPSULE: 300 | 30 days supply | Qty: 90 | Fill #1

## 2018-06-29 MED FILL — GABAPENTIN 300 MG CAPSULE: 300 | 30 days supply | Qty: 90 | Fill #2

## 2018-08-02 MED FILL — GABAPENTIN 300 MG CAPSULE: 300 | 30 days supply | Qty: 90 | Fill #3

## 2018-09-25 ENCOUNTER — Encounter (HOSPITAL_COMMUNITY): Payer: Self-pay

## 2018-09-25 ENCOUNTER — Other Ambulatory Visit: Payer: Self-pay

## 2018-09-25 ENCOUNTER — Emergency Department (HOSPITAL_COMMUNITY)
Admission: EM | Admit: 2018-09-25 | Discharge: 2018-09-25 | Disposition: A | Discharge status: Home / Self Care | Payer: Medicaid Other | Attending: Emergency Medicine | Admitting: Emergency Medicine

## 2018-09-25 DIAGNOSIS — R197 Diarrhea, unspecified: Secondary | ICD-10-CM

## 2018-09-25 DIAGNOSIS — R519 Headache, unspecified: Secondary | ICD-10-CM

## 2018-09-25 DIAGNOSIS — R51 Headache: Secondary | ICD-10-CM | POA: Insufficient documentation

## 2018-09-25 DIAGNOSIS — Z79899 Other long term (current) drug therapy: Secondary | ICD-10-CM | POA: Insufficient documentation

## 2018-09-25 DIAGNOSIS — R109 Unspecified abdominal pain: Secondary | ICD-10-CM | POA: Insufficient documentation

## 2018-09-25 DIAGNOSIS — R112 Nausea with vomiting, unspecified: Secondary | ICD-10-CM | POA: Insufficient documentation

## 2018-09-25 LAB — URINALYSIS, ROUTINE W REFLEX MICROSCOPIC
BILIRUBIN URINE: NEGATIVE
GLUCOSE, UA: NEGATIVE mg/dL
Hgb urine dipstick: NEGATIVE
KETONES UR: 15 mg/dL — AB
Leukocytes, UA: NEGATIVE
NITRITE: NEGATIVE
PH: 6.5 (ref 5.0–8.0)
Protein, ur: NEGATIVE mg/dL
SPECIFIC GRAVITY, URINE: 1.025 (ref 1.005–1.030)

## 2018-09-25 LAB — COMPREHENSIVE METABOLIC PANEL
ALBUMIN: 4 g/dL (ref 3.5–5.0)
ALT: 19 U/L (ref 0–44)
AST: 22 U/L (ref 15–41)
Alkaline Phosphatase: 91 U/L (ref 38–126)
Anion gap: 12 (ref 5–15)
BUN: 13 mg/dL (ref 6–20)
CHLORIDE: 100 mmol/L (ref 98–111)
CO2: 21 mmol/L — AB (ref 22–32)
CREATININE: 0.5 mg/dL (ref 0.44–1.00)
Calcium: 8.9 mg/dL (ref 8.9–10.3)
GFR calc Af Amer: >60 mL/min (ref >60)
GFR calc non Af Amer: >60 mL/min (ref >60)
GLUCOSE: 111 mg/dL — AB (ref 70–99)
POTASSIUM: 4 mmol/L (ref 3.5–5.1)
Sodium: 133 mmol/L — ABNORMAL LOW (ref 135–145)
Total Bilirubin: 0.8 mg/dL (ref 0.3–1.2)
Total Protein: 7.2 g/dL (ref 6.5–8.1)

## 2018-09-25 LAB — CBC
HEMATOCRIT: 43.2 % (ref 36.0–46.0)
HEMOGLOBIN: 12.8 g/dL (ref 12.0–15.0)
MCH: 27.1 pg (ref 26.0–34.0)
MCHC: 29.6 g/dL — AB (ref 30.0–36.0)
MCV: 91.3 fL (ref 80.0–100.0)
Platelets: 342 10*3/uL (ref 150–400)
RBC: 4.73 MIL/uL (ref 3.87–5.11)
RDW: 12.8 % (ref 11.5–15.5)
WBC: 12.6 10*3/uL — ABNORMAL HIGH (ref 4.0–10.5)
nRBC: 0 % (ref 0.0–0.2)

## 2018-09-25 LAB — LIPASE, BLOOD: LIPASE: 28 U/L (ref 11–51)

## 2018-09-25 LAB — PREGNANCY, URINE: Preg Test, Ur: NEGATIVE

## 2018-09-25 MED ORDER — SODIUM CHLORIDE 0.9 % IV BOLUS
1000.0000 mL | Freq: Once | INTRAVENOUS | Status: AC
  Administered 2018-09-25: 1000 mL via INTRAVENOUS

## 2018-09-25 MED ORDER — KETOROLAC TROMETHAMINE 30 MG/ML IJ SOLN
15.0000 mg | Freq: Once | INTRAMUSCULAR | Status: AC
  Administered 2018-09-25: 15 mg via INTRAVENOUS
  Filled 2018-09-25: qty 1

## 2018-09-25 MED ORDER — HYDROCODONE-ACETAMINOPHEN 5-325 MG PO TABS
1.0000 | ORAL_TABLET | Freq: Once | ORAL | Status: AC
  Administered 2018-09-25: 1 via ORAL
  Filled 2018-09-25: qty 1

## 2018-09-25 MED ORDER — ONDANSETRON 4 MG PO TBDP: 4.0000 mg | ORAL_TABLET | Freq: Three times a day (TID) | ORAL | 0 refills | Status: DC | PRN

## 2018-09-25 MED ORDER — ONDANSETRON HCL 4 MG/2ML IJ SOLN
4.0000 mg | Freq: Once | INTRAMUSCULAR | Status: AC
  Administered 2018-09-25: 4 mg via INTRAVENOUS
  Filled 2018-09-25: qty 2

## 2018-09-25 NOTE — ED Provider Notes (Signed)
[place] EMERGENCY DEPARTMENT Provider Note   CSN: [id number] Arrival date & time: 09/25/18  1933     History   Chief Complaint Chief Complaint  Patient presents with  . Abdominal Pain  . Emesis  . Diarrhea    HPI Leah Cain is a 47 y.o. female.  HPI Patient presents with upper abdominal pain.  Ate seafood yesterday and reportedly began to throw up and have diarrhea after.  Now it is nausea vomiting upper abdominal pain.  Husband ate at the same place but not the same food but states he did vomit a little yesterday.  Pain is worse when she tries to sit up.  Decreased appetite.  Also feels as if she is spinning around.  Has had chills without frank fever. Past Medical History:  Diagnosis Date  . Eczema     Patient Active Problem List   Diagnosis Date Noted  . Cervical disc disorder with radiculopathy of cervical region 11/07/2015  . Chronic low back pain 06/15/2014  . Positive H. pylori test 12/26/2013  . IUD retained ([place]) 09/19/2013  . Postprandial epigastric pain 09/19/2013  . Postprandial RUQ pain 09/19/2013    Past Surgical History:  Procedure Laterality Date  . cervical prolapse  2006     OB History    Gravida  4   Para  3   Term      Preterm      AB  1   Living        SAB  1   TAB      Ectopic      Multiple      Live Births  3            Home Medications    Prior to Admission medications   Medication Sig Start Date End Date Taking? Authorizing Provider  acetaminophen (TYLENOL) 500 MG tablet Take 500 mg by mouth every 6 (six) hours as needed for moderate pain.    [provider]  DULoxetine (CYMBALTA) 60 MG capsule Take 1 capsule (60 mg total) by mouth daily. 02/04/17   Jamse Arn, MD  meloxicam (MOBIC) 15 MG tablet Take 1 tablet (15 mg total) by mouth daily. 04/18/18   Zigmund Gottron, NP  ondansetron (ZOFRAN-ODT) 4 MG disintegrating tablet Take 1 tablet (4 mg total) by mouth every 8 (eight)  hours as needed for nausea or vomiting. 09/25/18   Davonna Belling, MD  predniSONE (DELTASONE) 20 MG tablet Two daily with food 02/20/18   Robyn Haber, MD  traMADol (ULTRAM) 50 MG tablet Take 1 tablet (50 mg total) by mouth every 6 (six) hours as needed. 02/20/18   Robyn Haber, MD    Family History Family History  Problem Relation Age of Onset  . Stroke Father     Social History Social History   Tobacco Use  . Smoking status: Never Smoker  . Smokeless tobacco: Never Used  Substance Use Topics  . Alcohol use: No  . Drug use: No     Allergies   Patient has no known allergies.   Review of Systems Review of Systems  Constitutional: Positive for appetite change and chills.  HENT: Negative for congestion.   Eyes: Negative for visual disturbance.  Respiratory: Negative for shortness of breath.   Gastrointestinal: Positive for abdominal pain, diarrhea, nausea and vomiting.  Genitourinary: Negative for flank pain.  Musculoskeletal: Negative for back pain.  Skin: Negative for rash.  Neurological: Positive for dizziness. Negative for weakness.  Psychiatric/Behavioral: Negative for confusion.     Physical Exam Updated Vital Signs BP 122/87 (BP Location: Right Arm)   Pulse (!) 50   Temp 97.6 F (36.4 C) (Oral)   Resp 16   SpO2 98%   Physical Exam  Constitutional: She appears well-developed.  HENT:  Head: Normocephalic.  Eyes: Pupils are equal, round, and reactive to light.  Cardiovascular: Normal rate and regular rhythm.  Pulmonary/Chest: Breath sounds normal.  Abdominal: Normal appearance. There is no tenderness.  Neurological: She is alert.  Skin: Skin is warm. Capillary refill takes less than 2 seconds.     ED Treatments / Results  Labs (all labs ordered are listed, but only abnormal results are displayed) Labs Reviewed  COMPREHENSIVE METABOLIC PANEL - Abnormal; Notable for the following components:      Result Value   Sodium 133 (*)    CO2 21 (*)      Glucose, Bld 111 (*)    All other components within normal limits  CBC - Abnormal; Notable for the following components:   WBC 12.6 (*)    MCHC 29.6 (*)    All other components within normal limits  URINALYSIS, ROUTINE W REFLEX MICROSCOPIC - Abnormal; Notable for the following components:   APPearance HAZY (*)    Ketones, ur 15 (*)    All other components within normal limits  LIPASE, BLOOD  PREGNANCY, URINE    EKG None  Radiology No results found.  Procedures Procedures (including critical care time)  Medications Ordered in ED Medications  sodium chloride 0.9 % bolus 1,000 mL (0 mLs Intravenous Stopped 09/25/18 2247)  ondansetron (ZOFRAN) injection 4 mg (4 mg Intravenous Given 09/25/18 2054)  ketorolac (TORADOL) 30 MG/ML injection 15 mg (15 mg Intravenous Given 09/25/18 2215)  HYDROcodone-acetaminophen (NORCO/VICODIN) 5-325 MG per tablet 1 tablet (1 tablet Oral Given 09/25/18 2247)     Initial Impression / Assessment and Plan / ED Course  I have reviewed the triage vital signs and the nursing notes.  Pertinent labs & imaging results that were available during my care of the patient were reviewed by me and considered in my medical decision making (see chart for details).     Patient with abdominal pain.  Also nausea vomiting diarrhea.  Recently potentially ate some bad food.  Had a headache but feels better.  States tends to get headaches like this.  Feels better after some treatment.  Give Zofran for home.  White count mildly elevated.  We will follow-up as needed and return for worsening symptoms.  Final Clinical Impressions(s) / ED Diagnoses   Final diagnoses:  Nausea vomiting and diarrhea  Abdominal pain, unspecified abdominal location  Nonintractable headache, unspecified chronicity pattern, unspecified headache type    ED Discharge Orders         Ordered    ondansetron (ZOFRAN-ODT) 4 MG disintegrating tablet  Every 8 hours PRN     09/25/18 2320            Davonna Belling, MD 09/26/18 0000

## 2018-09-25 NOTE — ED Notes (Signed)
Pt drinking water, tolerating well

## 2018-09-25 NOTE — ED Triage Notes (Signed)
After eating at seafood restaurant yesterday pt started having abd pain, vomiting x 4, diarrhea x 1.  Husband ate different foods and vomited x 1.

## 2018-10-14 MED FILL — GABAPENTIN 300 MG CAPSULE: 300 | 30 days supply | Qty: 90 | Fill #0

## 2018-11-02 MED FILL — ACETAMINOPHEN/COD #3 TABLET: 300-30 | 3 days supply | Qty: 12 | Fill #0

## 2018-11-09 MED FILL — GABAPENTIN 300 MG CAPSULE: 300 | 30 days supply | Qty: 90 | Fill #1

## 2018-12-06 MED FILL — ACETAMINOPHEN/COD #3 TABLET: 300-30 | 4 days supply | Qty: 16 | Fill #0

## 2018-12-09 MED FILL — GABAPENTIN 300 MG CAPSULE: 300 | 30 days supply | Qty: 90 | Fill #0

## 2018-12-09 MED FILL — oxyCODONE HCL 10 MG TABS: 10 | 30 days supply | Qty: 60 | Fill #0

## 2019-01-04 MED FILL — GABAPENTIN 300 MG CAPSULE: 300 | 30 days supply | Qty: 90 | Fill #1

## 2019-01-31 MED FILL — GABAPENTIN 300 MG CAPSULE: 300 | 30 days supply | Qty: 90 | Fill #2

## 2019-03-02 MED FILL — GABAPENTIN 300 MG CAPSULE: 300 | 30 days supply | Qty: 90 | Fill #3

## 2019-03-07 MED FILL — CHLORHEXIDINE 0.12% RINSE: 0.12 | 15 days supply | Qty: 473 | Fill #0

## 2019-03-07 MED FILL — HYDROCODON-APAP 5-325: 5-325 | 5 days supply | Qty: 18 | Fill #0

## 2019-04-01 MED FILL — GABAPENTIN 300 MG CAPSULE: 300 | 30 days supply | Qty: 90 | Fill #0

## 2019-04-01 MED FILL — oxyCODONE HCL 10 MG TABS: 10 | 30 days supply | Qty: 60 | Fill #0

## 2019-04-27 MED FILL — GABAPENTIN 300 MG CAPSULE: 300 | 30 days supply | Qty: 90 | Fill #1

## 2019-05-24 MED FILL — GABAPENTIN 300 MG CAPSULE: 300 | 30 days supply | Qty: 90 | Fill #2

## 2019-06-21 MED FILL — GABAPENTIN 300 MG CAPSULE: 300 | 30 days supply | Qty: 90 | Fill #3

## 2019-07-01 MED FILL — MONTELUKAST SOD 10 MG TAB: 10 | 30 days supply | Qty: 30 | Fill #0

## 2019-07-01 MED FILL — oxyCODONE HCL 10 MG TABS: 10 | 30 days supply | Qty: 60 | Fill #0

## 2019-07-01 MED FILL — GABAPENTIN 300 MG CAPSULE: 300 | 30 days supply | Qty: 90 | Fill #0

## 2019-07-26 MED FILL — GABAPENTIN 300 MG CAPSULE: 300 | 30 days supply | Qty: 90 | Fill #1

## 2019-08-02 MED FILL — MONTELUKAST SOD 10 MG TAB: 10 | 30 days supply | Qty: 30 | Fill #1

## 2019-08-18 MED FILL — GABAPENTIN 300 MG CAPSULE: 300 | 30 days supply | Qty: 90 | Fill #2

## 2019-09-12 MED FILL — MONTELUKAST SOD 10 MG TAB: 10 | 30 days supply | Qty: 30 | Fill #2

## 2019-09-13 MED FILL — GABAPENTIN 300 MG CAPSULE: 300 | 30 days supply | Qty: 90 | Fill #3

## 2019-11-07 MED FILL — MONTELUKAST SOD 10 MG TAB: 10 | 30 days supply | Qty: 30 | Fill #3

## 2020-01-11 MED FILL — GABAPENTIN 300 MG CAPSULE: 300 | 30 days supply | Qty: 180 | Fill #0

## 2020-01-11 MED FILL — oxyCODONE HCL 10 MG TABS: 10 | 45 days supply | Qty: 90 | Fill #0

## 2020-01-11 MED FILL — MONTELUKAST SOD 10 MG TAB: 10 | 30 days supply | Qty: 30 | Fill #4

## 2020-01-25 ENCOUNTER — Ambulatory Visit: Payer: Medicaid Other | Attending: Internal Medicine

## 2020-01-25 DIAGNOSIS — Z20822 Contact with and (suspected) exposure to covid-19: Secondary | ICD-10-CM

## 2020-01-26 LAB — SARS-COV-2, NAA 2 DAY TAT

## 2020-01-26 LAB — NOVEL CORONAVIRUS, NAA: SARS-CoV-2, NAA: NOT DETECTED

## 2020-02-07 MED FILL — GABAPENTIN 300 MG CAPSULE: 300 | 30 days supply | Qty: 180 | Fill #1

## 2020-04-20 MED FILL — oxyCODONE HCL 10 MG TABS: 10 | 45 days supply | Qty: 90 | Fill #0

## 2020-04-20 MED FILL — MONTELUKAST SOD 10 MG TAB: 10 | 90 days supply | Qty: 90 | Fill #0

## 2020-04-20 MED FILL — GABAPENTIN 300 MG CAPSULE: 300 | 30 days supply | Qty: 180 | Fill #0

## 2020-06-13 MED FILL — GABAPENTIN 300 MG CAPSULE: 300 | 30 days supply | Qty: 180 | Fill #1

## 2020-07-16 MED FILL — GABAPENTIN 300 MG CAPSULE: 300 | 30 days supply | Qty: 180 | Fill #2

## 2020-08-16 MED FILL — GABAPENTIN 300 MG CAPSULE: 300 | 30 days supply | Qty: 180 | Fill #3

## 2020-08-20 ENCOUNTER — Other Ambulatory Visit (HOSPITAL_COMMUNITY): Payer: Self-pay | Admitting: *Deleted

## 2020-08-20 MED FILL — oxyCODONE HCL 10 MG TABS: 10 | 45 days supply | Qty: 90 | Fill #0

## 2020-08-20 MED FILL — MELOXICAM 15 MG TABLET: 15 | 90 days supply | Qty: 90 | Fill #0

## 2020-09-05 ENCOUNTER — Other Ambulatory Visit (HOSPITAL_COMMUNITY): Payer: Self-pay | Admitting: Oral Surgery

## 2020-09-05 MED FILL — ACETAMINOPHEN/COD #3 TABLET: 300-30 | 3 days supply | Qty: 12 | Fill #0

## 2020-09-06 ENCOUNTER — Other Ambulatory Visit (HOSPITAL_COMMUNITY): Payer: Self-pay | Admitting: Oral Surgery

## 2020-09-06 MED FILL — HYDROCODON-APAP 5-325: 5-325 | 2 days supply | Qty: 10 | Fill #0

## 2020-09-06 MED FILL — PENICILLIN VK 500 MG TABLET: 500 | 7 days supply | Qty: 28 | Fill #0

## 2020-09-17 MED FILL — GABAPENTIN 300 MG CAPSULE: 300 | 90 days supply | Qty: 270 | Fill #0

## 2020-09-25 ENCOUNTER — Other Ambulatory Visit (HOSPITAL_COMMUNITY): Payer: Self-pay | Admitting: *Deleted

## 2020-09-26 MED FILL — valACYclovir HCL 1 GM TABS: 1 | 7 days supply | Qty: 21 | Fill #0

## 2020-10-03 ENCOUNTER — Other Ambulatory Visit (HOSPITAL_COMMUNITY): Payer: Self-pay | Admitting: Physician Assistant

## 2020-10-03 MED FILL — oxyCODONE HCL 10 MG TABS: 10 | 45 days supply | Qty: 90 | Fill #0

## 2020-10-03 MED FILL — GABAPENTIN 300 MG CAPSULE: 300 | 30 days supply | Qty: 180 | Fill #0

## 2020-10-03 MED FILL — MONTELUKAST SOD 10 MG TAB: 10 | 90 days supply | Qty: 90 | Fill #0

## 2020-10-03 MED FILL — valACYclovir HCL 1 GM TABS: 1 | 10 days supply | Qty: 30 | Fill #0

## 2020-11-01 MED FILL — GABAPENTIN 300 MG CAPSULE: 300 | 30 days supply | Qty: 180 | Fill #1

## 2020-11-27 MED FILL — GABAPENTIN 300 MG CAPSULE: 300 | 30 days supply | Qty: 180 | Fill #2

## 2020-12-21 MED FILL — GABAPENTIN 300 MG CAPSULE: 300 | 30 days supply | Qty: 180 | Fill #3

## 2021-01-07 ENCOUNTER — Other Ambulatory Visit (HOSPITAL_COMMUNITY): Payer: Self-pay | Admitting: Physician Assistant

## 2021-03-28 ENCOUNTER — Other Ambulatory Visit (HOSPITAL_COMMUNITY): Payer: Self-pay

## 2021-03-28 MED ORDER — AMOXICILLIN 875 MG PO TABS
875.0000 mg | ORAL_TABLET | Freq: Two times a day (BID) | ORAL | 0 refills | Status: DC
  Filled 2021-03-28: qty 20, 10d supply, fill #0

## 2021-04-17 ENCOUNTER — Encounter (HOSPITAL_COMMUNITY): Payer: Self-pay

## 2021-04-17 ENCOUNTER — Emergency Department (HOSPITAL_COMMUNITY): Payer: 59

## 2021-04-17 ENCOUNTER — Other Ambulatory Visit: Payer: Self-pay

## 2021-04-17 ENCOUNTER — Emergency Department (HOSPITAL_COMMUNITY)
Admission: EM | Admit: 2021-04-17 | Discharge: 2021-04-18 | Disposition: A | Discharge status: Home / Self Care | Payer: 59 | Attending: Emergency Medicine | Admitting: Emergency Medicine

## 2021-04-17 DIAGNOSIS — R519 Headache, unspecified: Secondary | ICD-10-CM | POA: Insufficient documentation

## 2021-04-17 NOTE — ED Provider Notes (Signed)
Emergency Medicine Provider Triage Evaluation Note  Leah Cain , a 50 y.o. female  was evaluated in triage.  Pt complains of headache.  Complaining of a headache in the frontal lobe and back of the head x1 month worsening over the last week.  No relief with Tylenol ibuprofen.  Has been at bedside states that she got some kind of tooth implant last year, dentist wanted to "remove something", but they got scared and decided not to.  Husband does not know if this is contributing to it.  She denies any prior history of headache.  Denies any blurry vision, nausea or vomiting.  Review of Systems  Positive:  Negative:   Physical Exam  BP 114/84 (BP Location: Left Arm)   Pulse 93   Temp 97.8 F (36.6 C) (Oral)   Resp 17   SpO2 98%  Gen:   Awake, no distress   Resp:  Normal effort  MSK:   Moves extremities without difficulty  Other:  Smile equal, 5/5 strength in upper and lower extremities, negative pronator drift  Medical Decision Making  Medically screening exam initiated at 6:48 PM.  Appropriate orders placed.  Rosali Augello was informed that the remainder of the evaluation will be completed by another provider, this initial triage assessment does not replace that evaluation, and the importance of remaining in the ED until their evaluation is complete.     Lyndel Safe 04/17/21 1850    Luna Fuse, MD 04/26/21 [phone]

## 2021-04-17 NOTE — ED Triage Notes (Signed)
Patient brought in by husband.    C/o frontal lobe headache X1 month and the last week it has been worse.   No relief with tylenol or ibuprofen.  Reports tooth implant last year.    A/ox[facility] Ambulatory in triage

## 2021-04-18 LAB — CBC
HCT: 41.9 % (ref 36.0–46.0)
Hemoglobin: 13.3 g/dL (ref 12.0–15.0)
MCH: 28.9 pg (ref 26.0–34.0)
MCHC: 31.7 g/dL (ref 30.0–36.0)
MCV: 91.1 fL (ref 80.0–100.0)
Platelets: 320 10*3/uL (ref 150–400)
RBC: 4.6 MIL/uL (ref 3.87–5.11)
RDW: 12.9 % (ref 11.5–15.5)
WBC: 9.4 10*3/uL (ref 4.0–10.5)
nRBC: 0 % (ref 0.0–0.2)

## 2021-04-18 LAB — BASIC METABOLIC PANEL
Anion gap: 10 (ref 5–15)
BUN: 16 mg/dL (ref 6–20)
CO2: 26 mmol/L (ref 22–32)
Calcium: 9.3 mg/dL (ref 8.9–10.3)
Chloride: 102 mmol/L (ref 98–111)
Creatinine, Ser: 0.69 mg/dL (ref 0.44–1.00)
GFR, Estimated: >60 mL/min (ref >60)
Glucose, Bld: 153 mg/dL — ABNORMAL HIGH (ref 70–99)
Potassium: 3.6 mmol/L (ref 3.5–5.1)
Sodium: 138 mmol/L (ref 135–145)

## 2021-04-18 MED ORDER — DEXAMETHASONE 4 MG PO TABS
10.0000 mg | ORAL_TABLET | Freq: Once | ORAL | Status: AC
  Administered 2021-04-18: 10 mg via ORAL
  Filled 2021-04-18: qty 2

## 2021-04-18 MED ORDER — KETOROLAC TROMETHAMINE 15 MG/ML IJ SOLN
15.0000 mg | Freq: Once | INTRAMUSCULAR | Status: AC
  Administered 2021-04-18: 15 mg via INTRAVENOUS
  Filled 2021-04-18: qty 1

## 2021-04-18 MED ORDER — METOCLOPRAMIDE HCL 5 MG/ML IJ SOLN
10.0000 mg | INTRAMUSCULAR | Status: AC
  Administered 2021-04-18: 10 mg via INTRAVENOUS
  Filled 2021-04-18: qty 2

## 2021-04-18 NOTE — Discharge Instructions (Addendum)
Your head CT in the emergency department was negative and the remainder of your evaluation was reassuring.  We recommend follow-up with your primary care doctor for further evaluation of your ongoing headache.

## 2021-04-18 NOTE — ED Provider Notes (Signed)
[facility] DEPT Provider Note   CSN: [id number] Arrival date & time: 04/17/21  1806     History Chief Complaint  Patient presents with   Headache    Leah Cain is a 50 y.o. female.  The history is provided by the patient and the spouse.  Headache Pain location:  R temporal and frontal Quality:  Sharp (and "heavy") Radiates to: occiput. Onset quality:  Gradual Duration:  4 weeks Timing:  Constant Progression:  Waxing and waning Chronicity:  New Similar to prior headaches: no   Context: loud noise   Context: not exposure to bright light   Relieved by:  Nothing Worsened by:  Nothing Ineffective treatments:  Acetaminophen and NSAIDs Associated symptoms: no blurred vision, no congestion, no fever, no loss of balance, no photophobia, no vomiting and no weakness       Past Medical History:  Diagnosis Date   Eczema     Patient Active Problem List   Diagnosis Date Noted   Cervical disc disorder with radiculopathy of cervical region 11/07/2015   Chronic low back pain 06/15/2014   Positive H. pylori test 12/26/2013   IUD retained ([place]) 09/19/2013   Postprandial epigastric pain 09/19/2013   Postprandial RUQ pain 09/19/2013    Past Surgical History:  Procedure Laterality Date   cervical prolapse  2006     OB History     Gravida  4   Para  3   Term      Preterm      AB  1   Living         SAB  1   IAB      Ectopic      Multiple      Live Births  3           Family History  Problem Relation Age of Onset   Stroke Father     Social History   Tobacco Use   Smoking status: Never   Smokeless tobacco: Never  Substance Use Topics   Alcohol use: No   Drug use: No    Home Medications Prior to Admission medications   Medication Sig Start Date End Date Taking? Authorizing Provider  acetaminophen (TYLENOL) 500 MG tablet Take 500 mg by mouth every 6 (six) hours as needed for moderate pain.    [provider]  amoxicillin (AMOXIL) 875 MG tablet Take 1 tablet (875 mg total) by mouth every 12 (twelve) hours until gone. 03/28/21     DULoxetine (CYMBALTA) 60 MG capsule Take 1 capsule (60 mg total) by mouth daily. 02/04/17   Jamse Arn, MD  gabapentin (NEURONTIN) 300 MG capsule TAKE 2 CAPSULES BY MOUTH 3 TIMES DAILY 01/07/21 01/07/22  Long, Scott, PA-C  gabapentin (NEURONTIN) 300 MG capsule TAKE 2 CAPSULES 3 TIMES A DAY BY MOUTH. 10/03/20 10/03/21  Long, Scott, PA-C  meloxicam (MOBIC) 15 MG tablet Take 1 tablet (15 mg total) by mouth daily. 04/18/18   Zigmund Gottron, NP  meloxicam (MOBIC) 15 MG tablet TAKE 1 TABLET BY MOUTH ONCE A DAY IN THE MORNING 08/20/20 08/20/21  Millsaps, Joelene Millin, NP  montelukast (SINGULAIR) 10 MG tablet TAKE 1 TABLET BY MOUTH ONCE DAILY 10/03/20 10/03/21  Long, Scott, PA-C  ondansetron (ZOFRAN-ODT) 4 MG disintegrating tablet Take 1 tablet (4 mg total) by mouth every 8 (eight) hours as needed for nausea or vomiting. 09/25/18   Davonna Belling, MD  penicillin v potassium (VEETID) 500 MG tablet TAKE 1 TABLET BY  MOUTH FOUR TIMES DAILY FOR 7 DAYS 09/06/20 09/06/21  Azzie Glatter, DDS  predniSONE (DELTASONE) 20 MG tablet Two daily with food 02/20/18   Robyn Haber, MD  traMADol (ULTRAM) 50 MG tablet Take 1 tablet (50 mg total) by mouth every 6 (six) hours as needed. 02/20/18   Robyn Haber, MD  valACYclovir (VALTREX) 1000 MG tablet TAKE 1 TABLET BY MOUTH 3 TIMES DAILY FOR 10 DAYS 10/03/20 10/03/21  Long, Scott, PA-C  valACYclovir (VALTREX) 1000 MG tablet TAKE 1 TABLET 3 TIMES A DAY BY MOUTH FOR 7 DAYS. 09/25/20 09/25/21  Everardo Beals, NP    Allergies    Patient has no known allergies.  Review of Systems   Review of Systems  Constitutional:  Negative for fever.  HENT:  Negative for congestion.   Eyes:  Negative for blurred vision and photophobia.  Gastrointestinal:  Negative for vomiting.  Neurological:  Positive for headaches. Negative for weakness and  loss of balance.  Ten systems reviewed and are negative for acute change, except as noted in the HPI.    Physical Exam Updated Vital Signs BP 101/65   Pulse 66   Temp 97.8 F (36.6 C) (Oral)   Resp 18   SpO2 97%   Physical Exam Vitals and nursing note reviewed.  Constitutional:      General: She is not in acute distress.    Appearance: She is well-developed. She is not diaphoretic.     Comments: Nontoxic-appearing and in no acute distress  HENT:     Head: Normocephalic and atraumatic.     Right Ear: External ear normal.     Left Ear: External ear normal.  Eyes:     General: No scleral icterus.    Conjunctiva/sclera: Conjunctivae normal.  Neck:     Comments: No nuchal rigidity or meningismus Pulmonary:     Effort: Pulmonary effort is normal. No respiratory distress.  Musculoskeletal:        General: Normal range of motion.     Cervical back: Normal range of motion.  Skin:    General: Skin is warm and dry.     Coloration: Skin is not pale.     Findings: No erythema or rash.  Neurological:     Mental Status: She is alert and oriented to person, place, and time.     Coordination: Coordination normal.     Comments: GCS 15.  Speech is clear, goal oriented.  No focal deficits appreciated.  Moving all extremities spontaneously, symmetrically.  Psychiatric:        Behavior: Behavior normal.    ED Results / Procedures / Treatments   Labs (all labs ordered are listed, but only abnormal results are displayed) Labs Reviewed  BASIC METABOLIC PANEL - Abnormal; Notable for the following components:      Result Value   Glucose, Bld 153 (*)    All other components within normal limits  CBC    EKG None  Radiology CT Head Wo Contrast  Result Date: 04/17/2021 CLINICAL DATA:  Frontal lobe headache. EXAM: CT HEAD WITHOUT CONTRAST TECHNIQUE: Contiguous axial images were obtained from the base of the skull through the vertex without intravenous contrast. COMPARISON:  None.  FINDINGS: Brain: No evidence of acute infarction, hemorrhage, hydrocephalus, extra-axial collection or mass lesion/mass effect. Vascular: No hyperdense vessel or unexpected calcification. Skull: Normal. Negative for fracture or focal lesion. Sinuses/Orbits: No acute finding. Other: None. IMPRESSION: No acute intracranial abnormality. Electronically Signed   By: Joyce Gross.D.  On: 04/17/2021 19:44    Procedures Procedures   Medications Ordered in ED Medications  metoCLOPramide (REGLAN) injection 10 mg (10 mg Intravenous Given 04/18/21 0129)  ketorolac (TORADOL) 15 MG/ML injection 15 mg (15 mg Intravenous Given 04/18/21 0129)  dexamethasone (DECADRON) tablet 10 mg (10 mg Oral Given 04/18/21 0128)    ED Course  I have reviewed the triage vital signs and the nursing notes.  Pertinent labs & imaging results that were available during my care of the patient were reviewed by me and considered in my medical decision making (see chart for details).  Clinical Course as of 04/18/21 0205  Thu Apr 18, 2021  0109 Patient with 1 month of a right sided headache.  She has no fever, nuchal rigidity, meningismus to suggest meningitis.  Not hypertensive and chronicity makes temporal arteritis unlikely.  Denies acute vision changes or loss.  No recent head injury or trauma.  Screening head CT was ordered in triage.  This shows no acute intracranial abnormality or mass.  She has no focal deficits on exam to suggest CVA.  Plan for migraine cocktail medications and reassessment.  Will require outpatient PCP follow-up. [KH]  8309 Per RN, patient reporting improvement in symptoms.  Requesting discharge.  She is hemodynamically stable. [KH]    Clinical Course User Index [KH] Antonietta Breach, PA-C   MDM Rules/Calculators/A&P                          Patient presents to the emergency department for evaluation of headache which began 1 month ago.  Patient with no history of recent head injury or trauma.  No fever,  nuchal rigidity, meningismus to suggest meningitis.  Neurologic exam today is nonfocal.  CT reassuring.   On reassessment, the patient has had significant improvement in headache symptoms following a migraine cocktail.  I do not believe further emergent workup is indicated at this time.  Return precautions discussed and provided.  Patient discharged in stable condition with no unaddressed concerns.   Final Clinical Impression(s) / ED Diagnoses Final diagnoses:  Bad headache    Rx / DC Orders ED Discharge Orders     None        Antonietta Breach, PA-C 04/18/21 0205    Maudie Flakes, MD 04/18/21 [phone]

## 2021-05-15 ENCOUNTER — Other Ambulatory Visit (HOSPITAL_COMMUNITY): Payer: Self-pay

## 2021-05-15 MED ORDER — AZITHROMYCIN 250 MG PO TABS
ORAL_TABLET | ORAL | 0 refills | Status: DC
  Filled 2021-05-15: qty 6, 5d supply, fill #0

## 2021-05-15 MED ORDER — ALBUTEROL SULFATE HFA 108 (90 BASE) MCG/ACT IN AERS
INHALATION_SPRAY | RESPIRATORY_TRACT | 3 refills | Status: DC
  Filled 2021-05-15: qty 18, 16d supply, fill #0

## 2021-05-15 MED ORDER — PREDNISONE 20 MG PO TABS
ORAL_TABLET | ORAL | 0 refills | Status: DC
  Filled 2021-05-15: qty 18, 9d supply, fill #0

## 2021-05-15 MED ORDER — OXYCODONE HCL 10 MG PO TABS
ORAL_TABLET | ORAL | 0 refills | Status: DC
  Filled 2021-05-15: qty 20, 10d supply, fill #0

## 2021-05-15 MED ORDER — PREGABALIN 150 MG PO CAPS
ORAL_CAPSULE | ORAL | 5 refills | Status: DC
  Filled 2021-05-15: qty 60, 30d supply, fill #0
  Filled 2021-06-24 – 2021-07-06 (×2): qty 60, 30d supply, fill #1
  Filled 2021-08-03: qty 60, 30d supply, fill #2
  Filled 2021-08-31: qty 60, 30d supply, fill #3
  Filled 2021-09-28 – 2021-10-28 (×3): qty 60, 30d supply, fill #4
  Filled 2021-11-21 – 2021-11-25 (×3): qty 60, 30d supply, fill #5
  Filled 2021-12-23: qty 60, 30d supply, fill #6
  Filled 2022-01-20: qty 60, 30d supply, fill #7
  Filled 2022-02-17 – 2022-03-15 (×3): qty 60, 30d supply, fill #8
  Filled 2022-04-12: qty 60, 30d supply, fill #9
  Filled 2022-05-06: qty 60, 30d supply, fill #10

## 2021-06-24 ENCOUNTER — Other Ambulatory Visit (HOSPITAL_COMMUNITY): Payer: Self-pay

## 2021-06-25 ENCOUNTER — Other Ambulatory Visit (HOSPITAL_COMMUNITY): Payer: Self-pay

## 2021-06-25 MED FILL — Montelukast Sodium Tab 10 MG (Base Equiv): ORAL | 90 days supply | Qty: 90 | Fill #0 | Status: CN

## 2021-06-28 ENCOUNTER — Other Ambulatory Visit (HOSPITAL_COMMUNITY): Payer: Self-pay

## 2021-07-04 ENCOUNTER — Other Ambulatory Visit (HOSPITAL_COMMUNITY): Payer: Self-pay

## 2021-07-06 ENCOUNTER — Other Ambulatory Visit (HOSPITAL_COMMUNITY): Payer: Self-pay

## 2021-08-03 ENCOUNTER — Other Ambulatory Visit (HOSPITAL_COMMUNITY): Payer: Self-pay

## 2021-08-06 ENCOUNTER — Other Ambulatory Visit (HOSPITAL_COMMUNITY): Payer: Self-pay

## 2021-08-06 MED ORDER — PREDNISONE 10 MG PO TABS
ORAL_TABLET | ORAL | 0 refills | Status: DC
  Filled 2021-08-06 (×2): qty 21, 6d supply, fill #0

## 2021-08-20 ENCOUNTER — Other Ambulatory Visit (HOSPITAL_COMMUNITY): Payer: Self-pay

## 2021-08-20 MED ORDER — AZITHROMYCIN 250 MG PO TABS
ORAL_TABLET | ORAL | 0 refills | Status: DC
  Filled 2021-08-20: qty 6, 5d supply, fill #0

## 2021-08-28 ENCOUNTER — Other Ambulatory Visit (HOSPITAL_COMMUNITY): Payer: Self-pay

## 2021-08-28 MED ORDER — ACETAMINOPHEN-CODEINE #3 300-30 MG PO TABS
ORAL_TABLET | ORAL | 0 refills | Status: DC
  Filled 2021-08-28: qty 30, 7d supply, fill #0

## 2021-08-28 MED ORDER — CLINDAMYCIN HCL 150 MG PO CAPS
ORAL_CAPSULE | ORAL | 1 refills | Status: DC
  Filled 2021-08-28: qty 28, 7d supply, fill #0

## 2021-08-29 ENCOUNTER — Other Ambulatory Visit (HOSPITAL_COMMUNITY): Payer: Self-pay

## 2021-08-29 MED ORDER — HYDROCODONE-ACETAMINOPHEN 5-325 MG PO TABS
1.0000 | ORAL_TABLET | Freq: Four times a day (QID) | ORAL | 0 refills | Status: DC | PRN
  Filled 2021-08-29: qty 15, 4d supply, fill #0

## 2021-08-31 ENCOUNTER — Other Ambulatory Visit (HOSPITAL_COMMUNITY): Payer: Self-pay

## 2021-09-11 ENCOUNTER — Other Ambulatory Visit (HOSPITAL_COMMUNITY): Payer: Self-pay

## 2021-09-11 MED ORDER — TRIAMCINOLONE ACETONIDE 0.1 % EX CREA
TOPICAL_CREAM | CUTANEOUS | 4 refills | Status: DC
  Filled 2021-09-11: qty 80, 30d supply, fill #0
  Filled 2021-10-10: qty 80, 20d supply, fill #0

## 2021-09-14 ENCOUNTER — Other Ambulatory Visit (HOSPITAL_COMMUNITY): Payer: Self-pay

## 2021-09-19 ENCOUNTER — Other Ambulatory Visit (HOSPITAL_COMMUNITY): Payer: Self-pay

## 2021-09-28 ENCOUNTER — Other Ambulatory Visit (HOSPITAL_COMMUNITY): Payer: Self-pay

## 2021-09-30 ENCOUNTER — Other Ambulatory Visit (HOSPITAL_COMMUNITY): Payer: Self-pay

## 2021-10-10 ENCOUNTER — Other Ambulatory Visit (HOSPITAL_COMMUNITY): Payer: Self-pay

## 2021-10-28 ENCOUNTER — Other Ambulatory Visit (HOSPITAL_COMMUNITY): Payer: Self-pay

## 2021-11-21 ENCOUNTER — Other Ambulatory Visit (HOSPITAL_COMMUNITY): Payer: Self-pay

## 2021-11-25 ENCOUNTER — Other Ambulatory Visit (HOSPITAL_COMMUNITY): Payer: Self-pay

## 2021-12-21 ENCOUNTER — Other Ambulatory Visit (HOSPITAL_COMMUNITY): Payer: Self-pay

## 2021-12-23 ENCOUNTER — Other Ambulatory Visit (HOSPITAL_COMMUNITY): Payer: Self-pay

## 2021-12-27 ENCOUNTER — Other Ambulatory Visit (HOSPITAL_COMMUNITY): Payer: Self-pay

## 2022-01-17 ENCOUNTER — Encounter: Payer: Self-pay | Admitting: Physician Assistant

## 2022-01-20 ENCOUNTER — Other Ambulatory Visit (HOSPITAL_COMMUNITY): Payer: Self-pay

## 2022-02-04 NOTE — Progress Notes (Signed)
? ? ? ?02/05/2022 ?Leah Cain ?[id number] ?Sep 11, 1971 ? ?Referring provider: [facility] ?Primary GI doctor: Dr. Ardis Hughs (doc of day) ? ?ASSESSMENT AND PLAN:  ? ?Screen for colon cancer ?We have discussed the risks of bleeding, infection, perforation, medication reactions, and remote risk of death associated with colonoscopy. All questions were answered and the patient acknowledges these risk and wishes to proceed. ? ?Post prandial AB pain with Abdominal bloating ?Will check for celiac.  ?? History of H pylori- will get H pylori stool before starting PPI and will schedule for EGD with colonoscopy to evaluate further ?Will treat constipation/IBS miralax and fiber  ?Given FODMAP information and discussed diet.  ?If negative- Will check for versus doing trial of Xifaxin if EGD negative discussed with patient.  ?-     CBC with Differential/Platelet; Future ?-     Comprehensive metabolic panel; Future ?-     Tissue transglutaminase, IgA; Future ?-     IgA; Future ?-     Helicobacter pylori special antigen; Future ?-     pantoprazole (PROTONIX) 40 MG tablet; Take 1 tablet (40 mg total) by mouth 2 (two) times daily before a meal. ? ?Drug-induced constipation ?- Increase fiber/ water intake, decrease caffeine, increase activity level. ?-Will add on Miralax daily and Benefiber ?- Please go to the hospital if you have severe abdominal pain, vomiting, fever, CP, SOB.  ? ?Arthritis/hand swelling ?Follow up with PCP for further evaluation ?Will get CRP ? ?Patient Care Team: ?[facility] - General ? ?HISTORY OF PRESENT ILLNESS: ?51 y.o. female with a past medical history of arthritis with history of NSAID use, and others listed below presents for evaluation of abdominal bloating.  ? ?Negative hepatitis C 10/03/2020 ?02/16/2019 2019 hemoglobin 12.5, MCV 89, white blood cell count 8.1, platelets 373, creatinine 0.66, BUN 23, total bilirubin less than 0.2, alk phos 108, AST 18, ALT 19. ?Negative  H. pylori breath test 02/15/2018, on list has history of H pylori.  ?negative lipase. ?09/2013 abdominal ultrasound for postprandial abdominal discomfort unremarkable ? 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  ?She has history since 2014 of post prandial AB pain, history of H pylori positive assuming treated, last breath test 2019 negative.  ?Patient is self pay.  ?Husband is interpretor.  ?She had IUD and wanted removal 2009, this had migrated, had GA surgery to remove but unable to remove it due to fear of complication, would have to have hysterectomy.  ? ?If she drinks water/milk/food will have AB bloating immediatly after eating/drinking with AB discomfort.  ?No nausea, vomiting.  ?No GERD, no dysphagia.  ?Will take oxycodone to help.  ?Has constipation with oxycodone, every other day has BM, some straining, no blood in stool.  ? ?Current Medications:  ? ? ? ? ?Current Outpatient Medications (Analgesics):  ?  Oxycodone HCl 10 MG TABS, TAKE 1 TO 2 TABLETS BY MOUTH EVERY NIGHT AT BEDTIME AS NEEDED FOR SEVERE PAIN ?  acetaminophen (TYLENOL) 500 MG tablet, Take 500 mg by mouth every 6 (six) hours as needed for moderate pain. ? ? ?Current Outpatient Medications (Other):  ?  pantoprazole (PROTONIX) 40 MG tablet, Take 1 tablet (40 mg total) by mouth 2 (two) times daily before a meal. ?  pregabalin (LYRICA) 150 MG capsule, Take 1 capsule by mouth twice a day ?  gabapentin (NEURONTIN) 300 MG capsule, TAKE 2 CAPSULES BY MOUTH  3 TIMES DAILY ? ?Medical History:  ?Past Medical History:  ?Diagnosis Date  ? Eczema   ? 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 Metalic object  ? ?Allergies: No Known Allergies  ? ?Surgical History:  ?She  has a past surgical history that includes cervical prolapse (2006). ?Family History:  ?Her family history includes Stroke in her father. ?Social History:  ? reports that she has never smoked. She has never used smokeless  tobacco. She reports that she does not drink alcohol and does not use drugs. ? ?REVIEW OF SYSTEMS  : All other systems reviewed and negative except where noted in the History of Present Illness. ? ? ?PHYSICAL EXAM: ?BP 120/68   Pulse 72   Ht 5' 4"  (1.626 m)   Wt 183 lb 3.2 oz (83.1 kg)   BMI 31.45 kg/m?  ?General:   Pleasant, well developed female in no acute distress ?Head:  Normocephalic and atraumatic. ?Eyes: sclerae anicteric,conjunctive pink  ?Heart:  regular rate and rhythm ?Pulm: Clear anteriorly; no wheezing ?Abdomen:   Soft, Obese AB, Active bowel sounds. mild tenderness in the LLQ. Without guarding and Without rebound, No organomegaly appreciated. ?Extremities:  Without edema. ?Msk:  Symmetrical with bilateral bunions with deviations. Peripheral pulses intact.  ?Neurologic:  Alert and  oriented x4;  No focal deficits.  ?Skin:   Dry and intact without significant lesions or rashes. Shiny hands/skins, some mild MCP swelling bilateral hands, right worse than left.  ?Psychiatric: Cooperative. Normal mood and affect. ? ? ? ?Vladimir Crofts, PA-C ?11:46 AM ? ? ?

## 2022-02-05 ENCOUNTER — Ambulatory Visit (INDEPENDENT_AMBULATORY_CARE_PROVIDER_SITE_OTHER): Payer: 59 | Admitting: Physician Assistant

## 2022-02-05 ENCOUNTER — Encounter: Payer: Self-pay | Admitting: Physician Assistant

## 2022-02-05 ENCOUNTER — Other Ambulatory Visit (HOSPITAL_COMMUNITY): Payer: Self-pay

## 2022-02-05 ENCOUNTER — Other Ambulatory Visit (INDEPENDENT_AMBULATORY_CARE_PROVIDER_SITE_OTHER): Payer: 59

## 2022-02-05 VITALS — BP 120/68 | HR 72 | Ht 64.0 in | Wt 183.2 lb

## 2022-02-05 DIAGNOSIS — R140 Abdominal distension (gaseous): Secondary | ICD-10-CM

## 2022-02-05 DIAGNOSIS — Z1211 Encounter for screening for malignant neoplasm of colon: Secondary | ICD-10-CM | POA: Diagnosis not present

## 2022-02-05 DIAGNOSIS — R1084 Generalized abdominal pain: Secondary | ICD-10-CM

## 2022-02-05 DIAGNOSIS — K5903 Drug induced constipation: Secondary | ICD-10-CM

## 2022-02-05 LAB — COMPREHENSIVE METABOLIC PANEL
ALT: 20 U/L (ref 0–35)
AST: 17 U/L (ref 0–37)
Albumin: 4.3 g/dL (ref 3.5–5.2)
Alkaline Phosphatase: 83 U/L (ref 39–117)
BUN: 15 mg/dL (ref 6–23)
CO2: 28 mEq/L (ref 19–32)
Calcium: 9.1 mg/dL (ref 8.4–10.5)
Chloride: 106 mEq/L (ref 96–112)
Creatinine, Ser: 0.59 mg/dL (ref 0.40–1.20)
GFR: 103.35 mL/min (ref >60.00)
Glucose, Bld: 100 mg/dL — ABNORMAL HIGH (ref 70–99)
Potassium: 3.6 mEq/L (ref 3.5–5.1)
Sodium: 139 mEq/L (ref 135–145)
Total Bilirubin: 0.5 mg/dL (ref 0.2–1.2)
Total Protein: 7 g/dL (ref 6.0–8.3)

## 2022-02-05 LAB — HIGH SENSITIVITY CRP: CRP, High Sensitivity: 13.35 mg/L — ABNORMAL HIGH (ref 0.000–5.000)

## 2022-02-05 LAB — CBC WITH DIFFERENTIAL/PLATELET
Basophils Absolute: 0.1 10*3/uL (ref 0.0–0.1)
Basophils Relative: 1.3 % (ref 0.0–3.0)
Eosinophils Absolute: 0.2 10*3/uL (ref 0.0–0.7)
Eosinophils Relative: 3.6 % (ref 0.0–5.0)
HCT: 39.4 % (ref 36.0–46.0)
Hemoglobin: 12.8 g/dL (ref 12.0–15.0)
Lymphocytes Relative: 26.6 % (ref 12.0–46.0)
Lymphs Abs: 1.7 10*3/uL (ref 0.7–4.0)
MCHC: 32.6 g/dL (ref 30.0–36.0)
MCV: 87.6 fl (ref 78.0–100.0)
Monocytes Absolute: 0.4 10*3/uL (ref 0.1–1.0)
Monocytes Relative: 6.7 % (ref 3.0–12.0)
Neutro Abs: 3.9 10*3/uL (ref 1.4–7.7)
Neutrophils Relative %: 61.8 % (ref 43.0–77.0)
Platelets: 303 10*3/uL (ref 150.0–400.0)
RBC: 4.49 Mil/uL (ref 3.87–5.11)
RDW: 13.1 % (ref 11.5–15.5)
WBC: 6.3 10*3/uL (ref 4.0–10.5)

## 2022-02-05 MED ORDER — PANTOPRAZOLE SODIUM 40 MG PO TBEC
40.0000 mg | DELAYED_RELEASE_TABLET | Freq: Two times a day (BID) | ORAL | 0 refills | Status: DC
  Filled 2022-02-05: qty 60, 30d supply, fill #0

## 2022-02-05 MED ORDER — NA SULFATE-K SULFATE-MG SULF 17.5-3.13-1.6 GM/177ML PO SOLN
1.0000 | Freq: Once | ORAL | 0 refills | Status: AC
  Filled 2022-02-05: qty 354, 1d supply, fill #0

## 2022-02-05 NOTE — Patient Instructions (Addendum)
If you are age 53 or younger, your body mass index should be between 19-25. Your Body mass index is 31.45 kg/m?Marland Kitchen If this is out of the aformentioned range listed, please consider follow up with your Primary Care Provider.  ?________________________________________________________ ? ?The  GI providers would like to encourage you to use [facility] to communicate with providers for non-urgent requests or questions.  Due to long hold times on the telephone, sending your provider a message by [facility] may be a faster and more efficient way to get a response.  Please allow 48 business hours for a response.  Please remember that this is for non-urgent requests.  ?_______________________________________________________ ? ?You have been scheduled for an endoscopy and colonoscopy. Please follow the written instructions given to you at your visit today. ?Please pick up your prep supplies at the pharmacy within the next 1-3 days. ?If you use inhalers (even only as needed), please bring them with you on the day of your procedure. ? ?Your provider has requested that you go to the basement level for lab work before leaving today. Press "B" on the elevator. The lab is located at the first door on the left as you exit the elevator. ? ?Follow up pending the results of your procedures ? ?Thank you for entrusting me with your care and choosing [facility]. ? ?Vicie Mutters, PA-C ? ?Can start with pantoprazole AFTER YOU GIVE YOUR STOOL SAMPLES  ? ?Recommend starting on a fiber supplement, can try metamucil first but if this causes gas/bloating switch to benefiber ?Take with fiber with with a full 8 oz glass of water once a day. This can take 1 month to start helping, so try for at least one month.  ?Recommend increasing water and activity.  ?Get a squatty potty to use at home or try a stool, goal is to get your knees above your hips during a bowel movement. ? ?Miralax is an osmotic laxative.  ?It only brings more water into  the stool.  ?This is safe to take daily.  ?Can take up to 17 gram of miralax twice a day.  ?Mix with juice or coffee.  ?Start 1 capful at night for 3-4 days and reassess your response in 3-4 days.  ?You can increase and decrease the dose based on your response.  ?Remember, it can take up to 3-4 days to take effect OR for the effects to wear off.  ? ?I often pair this with benefiber in the morning to help assure the stool is not too loose.  ? ?- Drink at least 64-80 ounces of water/liquid per day. ?- Establish a time to try to move your bowels every day.  For many people, this is after a cup of coffee or after a meal such as breakfast. ?- Sit all of the way back on the toilet keeping your back fairly straight and while sitting up, try to rest the tops of your forearms on your upper thighs.   ?- Raising your feet with a step stool/squatty potty can be helpful to improve the angle that allows your stool to pass through the rectum. ?- Relax the rectum feeling it bulge toward the toilet water.  If you feel your rectum raising toward your body, you are contracting rather than relaxing. ?- Breathe in and slowly exhale. "Belly breath" by expanding your belly towards your belly button. Keep belly expanded as you gently direct pressure down and back to the anus.  A low pitched GRRR sound can assist with  increasing intra-abdominal pressure.  ?- Repeat 3-4 times. If unsuccessful, contract the pelvic floor to restore normal tone and get off the toilet.  Avoid excessive straining. ?- To reduce excessive wiping by teaching your anus to normally contract, place hands on outer aspect of knees and resist knee movement outward.  Hold 5-10 second then place hands just inside of knees and resist inward movement of knees.  Hold 5 seconds.  Repeat a few times each way. ? ?Go to the ER if unable to pass gas, severe AB pain, unable to hold down food, any shortness of breath of chest pain.  ? ?Abdominal bloating and discomfort may be due to  intestinal sensitivity or symptoms of irritable bowel syndrome. To relieve symptoms, avoid:  ?Broccoli  ?Baked beans  ?Cabbage  ?Carbonated drinks  ?Cauliflower  ?Chewing gum  ?Hard candy ?Abdominal distention resulting from weak abdominal muscles:  ?Is better in the morning  ?Gets worse as the day progresses  ?Is relieved by lying down ?Flatulence is gas created through bacterial action in the bowel and passed rectally. Keep in mind that:  ?10-18 passages per day are normal  ?Primary gases are harmless and odorless  ?Noticeable smells are trace gases related to food intake ?Foods to AVOID that are likely to form gas include:  ?Milk, dairy products, and medications that contain lactose--If your body doesn't produce the enzyme (lactase) to break it down.  ?Certain vegetables--baked beans, cauliflower, broccoli, cabbage  ?Certain starches--wheat, oats, corn, potatoes. Rice is a good substitute. ?Identify offending foods. Reduce or eliminate these gas-forming foods from your diet. Can look at the [place].  ? ?

## 2022-02-05 NOTE — Progress Notes (Signed)
I agree with the above note, plan 

## 2022-02-06 ENCOUNTER — Other Ambulatory Visit: Payer: 59

## 2022-02-06 DIAGNOSIS — R140 Abdominal distension (gaseous): Secondary | ICD-10-CM

## 2022-02-06 LAB — IGA: Immunoglobulin A: 169 mg/dL (ref 47–310)

## 2022-02-06 LAB — TISSUE TRANSGLUTAMINASE, IGA: (tTG) Ab, IgA: <1 U/mL

## 2022-02-07 LAB — HELICOBACTER PYLORI  SPECIAL ANTIGEN
MICRO NUMBER:: 13290447
SPECIMEN QUALITY: ADEQUATE

## 2022-02-10 ENCOUNTER — Other Ambulatory Visit (HOSPITAL_COMMUNITY): Payer: Self-pay

## 2022-02-10 ENCOUNTER — Other Ambulatory Visit: Payer: Self-pay

## 2022-02-10 DIAGNOSIS — R7982 Elevated C-reactive protein (CRP): Secondary | ICD-10-CM

## 2022-02-10 DIAGNOSIS — R140 Abdominal distension (gaseous): Secondary | ICD-10-CM

## 2022-02-10 MED ORDER — PANTOPRAZOLE SODIUM 40 MG PO TBEC
40.0000 mg | DELAYED_RELEASE_TABLET | Freq: Two times a day (BID) | ORAL | 0 refills | Status: AC
  Filled 2022-02-10: qty 90, 45d supply, fill #0

## 2022-02-17 ENCOUNTER — Other Ambulatory Visit (HOSPITAL_COMMUNITY): Payer: Self-pay

## 2022-02-19 ENCOUNTER — Other Ambulatory Visit (HOSPITAL_COMMUNITY): Payer: Self-pay

## 2022-03-15 ENCOUNTER — Other Ambulatory Visit (HOSPITAL_COMMUNITY): Payer: Self-pay

## 2022-03-19 ENCOUNTER — Encounter: Payer: Self-pay | Admitting: Gastroenterology

## 2022-03-19 ENCOUNTER — Ambulatory Visit (AMBULATORY_SURGERY_CENTER): Payer: Self-pay | Admitting: Gastroenterology

## 2022-03-19 VITALS — BP 124/85 | HR 69 | Temp 98.0°F | Resp 23 | Ht 64.0 in | Wt 183.0 lb

## 2022-03-19 DIAGNOSIS — R1013 Epigastric pain: Secondary | ICD-10-CM

## 2022-03-19 DIAGNOSIS — D124 Benign neoplasm of descending colon: Secondary | ICD-10-CM

## 2022-03-19 DIAGNOSIS — D120 Benign neoplasm of cecum: Secondary | ICD-10-CM

## 2022-03-19 DIAGNOSIS — R140 Abdominal distension (gaseous): Secondary | ICD-10-CM

## 2022-03-19 DIAGNOSIS — Z1211 Encounter for screening for malignant neoplasm of colon: Secondary | ICD-10-CM

## 2022-03-19 MED ORDER — SODIUM CHLORIDE 0.9 % IV SOLN: 500.0000 mL | Freq: Once | INTRAVENOUS | Status: DC

## 2022-03-19 NOTE — Progress Notes (Signed)
Called to room to assist during endoscopic procedure.  Patient ID and intended procedure confirmed with present staff. Received instructions for my participation in the procedure from the performing physician.  

## 2022-03-19 NOTE — Op Note (Signed)
[facility] Patient Name: Leah Cain Procedure Date: 03/19/2022 1:25 PM MRN: [id number] Endoscopist: Milus Banister , MD Age: 51 Referring MD:  Date of Birth: [birth year] Gender: Female Account #: [id number] Procedure:                Upper GI endoscopy Indications:              Dyspepsia, bloating Medicines:                Monitored Anesthesia Care Procedure:                Pre-Anesthesia Assessment:                           - Prior to the procedure, a History and Physical                            was performed, and patient medications and                            allergies were reviewed. The patient's tolerance of                            previous anesthesia was also reviewed. The risks                            and benefits of the procedure and the sedation                            options and risks were discussed with the patient.                            All questions were answered, and informed consent                            was obtained. Prior Anticoagulants: The patient has                            taken no previous anticoagulant or antiplatelet                            agents. ASA Grade Assessment: II - A patient with                            mild systemic disease. After reviewing the risks                            and benefits, the patient was deemed in                            satisfactory condition to undergo the procedure.                           After obtaining informed consent, the endoscope was  passed under direct vision. Throughout the                            procedure, the patient's blood pressure, pulse, and                            oxygen saturations were monitored continuously. The                            GIF [id number] was introduced through the                            mouth, and advanced to the second part of duodenum.                            The upper GI endoscopy was  accomplished without                            difficulty. The patient tolerated the procedure                            well. Scope In: Scope Out: Findings:                 The esophagus was normal.                           The stomach was normal.                           The examined duodenum was normal. Complications:            No immediate complications. Estimated blood loss:                            None. Estimated Blood Loss:     Estimated blood loss: none. Impression:               - Normal UGI tract.                           - Bloating ls likely from chronic constipation. Recommendation:           - Colonoscopy now. Milus Banister, MD 03/19/2022 1:45:33 PM This report has been signed electronically.

## 2022-03-19 NOTE — Op Note (Signed)
[place] Patient Name: Leah Cain Procedure Date: 03/19/2022 1:25 PM MRN: [id number] Endoscopist: Milus Banister , MD Age: 51 Referring MD:  Date of Birth: [birth year] Gender: Female Account #: [id number] Procedure:                Colonoscopy Indications:              Screening for colorectal malignant neoplasm Medicines:                Monitored Anesthesia Care Procedure:                Pre-Anesthesia Assessment:                           - Prior to the procedure, a History and Physical                            was performed, and patient medications and                            allergies were reviewed. The patient's tolerance of                            previous anesthesia was also reviewed. The risks                            and benefits of the procedure and the sedation                            options and risks were discussed with the patient.                            All questions were answered, and informed consent                            was obtained. Prior Anticoagulants: The patient has                            taken no previous anticoagulant or antiplatelet                            agents. ASA Grade Assessment: II - A patient with                            mild systemic disease. After reviewing the risks                            and benefits, the patient was deemed in                            satisfactory condition to undergo the procedure.                           After obtaining informed consent, the colonoscope  was passed under direct vision. Throughout the                            procedure, the patient's blood pressure, pulse, and                            oxygen saturations were monitored continuously. The                            CF [id number] #[id number] was introduced through the anus                            and advanced to the the cecum, identified by                            appendiceal orifice  and ileocecal valve. The                            colonoscopy was performed without difficulty. The                            patient tolerated the procedure well. The quality                            of the bowel preparation was good. The ileocecal                            valve, appendiceal orifice, and rectum were                            photographed. Scope In: 1:47:10 PM Scope Out: 1:59:59 PM Scope Withdrawal Time: 0 hours 8 minutes 33 seconds  Total Procedure Duration: 0 hours 12 minutes 49 seconds  Findings:                 A 1 mm polyp was found in the cecum. The polyp was                            sessile. The polyp was removed with a cold biopsy                            forceps. Resection and retrieval were complete.                           A 3 mm polyp was found in the descending colon. The                            polyp was sessile. The polyp was removed with a                            cold snare. Resection and retrieval were complete.  The exam was otherwise without abnormality on                            direct and retroflexion views. Complications:            No immediate complications. Estimated blood loss:                            None. Estimated Blood Loss:     Estimated blood loss: none. Impression:               - One 1 mm polyp in the cecum, removed with a cold                            biopsy forceps. Resected and retrieved.                           - One 3 mm polyp in the descending colon, removed                            with a cold snare. Resected and retrieved.                           - The examination was otherwise normal on direct                            and retroflexion views. Recommendation:           - Patient has a contact number available for                            emergencies. The signs and symptoms of potential                            delayed complications were discussed with the                             patient. Return to normal activities tomorrow.                            Written discharge instructions were provided to the                            patient.                           - Resume previous diet.                           - Continue present medications.                           - Await pathology results. Milus Banister, MD 03/19/2022 2:01:49 PM This report has been signed electronically.

## 2022-03-19 NOTE — Progress Notes (Signed)
HPI: This is a woman with bloating, otherwise routine risk for CRC   ROS: complete GI ROS as described in HPI, all other review negative.  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     gabapentin (NEURONTIN) 300 MG capsule TAKE 2 CAPSULES BY MOUTH 3 TIMES DAILY 180 capsule 3   Oxycodone HCl 10 MG TABS TAKE 1 TO 2 TABLETS BY MOUTH EVERY NIGHT AT BEDTIME AS NEEDED FOR SEVERE PAIN 20 tablet 0   pantoprazole (PROTONIX) 40 MG tablet Take 1 tablet by mouth 2 times daily before a meal. 90 tablet 0   pregabalin (LYRICA) 150 MG capsule Take 1 capsule by mouth twice a day 120 capsule 5   Current Facility-Administered Medications  Medication Dose Route Frequency Provider Last Rate Last Admin   0.9 %  sodium chloride infusion  500 mL Intravenous Once Milus Banister, MD        Allergies as of 03/19/2022   (No Known Allergies)    Family History  Problem Relation Age of Onset   Stroke Father     Social History   Socioeconomic History   Marital status: Married    Spouse name: Not on file   Number of children: Not on file   Years of education: Not on file   Highest education level: Not on file  Occupational History   Not on file  Tobacco Use   Smoking status: Never   Smokeless tobacco: Never  Substance and Sexual Activity   Alcohol use: No   Drug use: No   Sexual activity: Not on file  Other Topics Concern   Not on file  Social History Narrative   Not on file   Social Determinants of Health   Financial Resource Strain: Not on file  Food Insecurity: Not on file  Transportation Needs: Not on file  Physical  Activity: Not on file  Stress: Not on file  Social Connections: Not on file  Intimate Partner Violence: Not on file     Physical Exam: BP 114/67   Pulse 79   Temp 98 F (36.7 C)   Ht '5\' 4"'$  (1.626 m)   Wt 183 lb (83 kg)   SpO2 96%   BMI 31.41 kg/m  Constitutional: generally well-appearing Psychiatric: alert and oriented x3 Lungs: CTA bilaterally Heart: no MCR  Assessment and plan: 51 y.o. female with bloating, otherwiese routine risk for CRC  Colonoscopy and EGD today  Care is appropriate for the ambulatory setting.  Owens Loffler, MD [facility] 03/19/2022, 1:02 PM

## 2022-03-19 NOTE — Patient Instructions (Signed)
THank you for letting us take care of your healthcare needs today. Please see handouts given to you on Polyps.   YOU HAD AN ENDOSCOPIC PROCEDURE TODAY AT [facility]:   Refer to the procedure report that was given to you for any specific questions about what was found during the examination.  If the procedure report does not answer your questions, please call your gastroenterologist to clarify.  If you requested that your care partner not be given the details of your procedure findings, then the procedure report has been included in a sealed envelope for you to review at your convenience later.  YOU SHOULD EXPECT: Some feelings of bloating in the abdomen. Passage of more gas than usual.  Walking can help get rid of the air that was put into your GI tract during the procedure and reduce the bloating. If you had a lower endoscopy (such as a colonoscopy or flexible sigmoidoscopy) you may notice spotting of blood in your stool or on the toilet paper. If you underwent a bowel prep for your procedure, you may not have a normal bowel movement for a few days.  Please Note:  You might notice some irritation and congestion in your nose or some drainage.  This is from the oxygen used during your procedure.  There is no need for concern and it should clear up in a day or so.  SYMPTOMS TO REPORT IMMEDIATELY:  Following lower endoscopy (colonoscopy or flexible sigmoidoscopy):  Excessive amounts of blood in the stool  Significant tenderness or worsening of abdominal pains  Swelling of the abdomen that is new, acute  Fever of 100F or higher    For urgent or emergent issues, a gastroenterologist can be reached at any hour by calling [phone]. Do not use MyChart messaging for urgent concerns.    DIET:  We do recommend a small meal at first, but then you may proceed to your regular diet.  Drink plenty of fluids but you should avoid alcoholic beverages for 24 hours.  ACTIVITY:  You  should plan to take it easy for the rest of today and you should NOT DRIVE or use heavy machinery until tomorrow (because of the sedation medicines used during the test).    FOLLOW UP: Our staff will call the number listed on your records 48-72 hours following your procedure to check on you and address any questions or concerns that you may have regarding the information given to you following your procedure. If we do not reach you, we will leave a message.  We will attempt to reach you two times.  During this call, we will ask if you have developed any symptoms of COVID 19. If you develop any symptoms (ie: fever, flu-like symptoms, shortness of breath, cough etc.) before then, please call [phone].  If you test positive for Covid 19 in the 2 weeks post procedure, please call and report this information to [place].    If any biopsies were taken you will be contacted by phone or by letter within the next 1-3 weeks.  Please call us at [phone] if you have not heard about the biopsies in 3 weeks.    SIGNATURES/CONFIDENTIALITY: You and/or your care partner have signed paperwork which will be entered into your electronic medical record.  These signatures attest to the fact that that the information above on your After Visit Summary has been reviewed and is understood.  Full responsibility of the confidentiality of this discharge information lies with  you and/or your care-partner.

## 2022-03-19 NOTE — Progress Notes (Signed)
To pacu, VSS. Report to [email or web address] 

## 2022-03-20 ENCOUNTER — Telehealth: Payer: Self-pay | Admitting: *Deleted

## 2022-03-20 NOTE — Telephone Encounter (Signed)
  Follow up Call-     03/19/2022   12:43 PM  Call back number  Post procedure Call Back phone  # [phone]  Permission to leave phone message Yes     Patient questions:  Do you have a fever, pain , or abdominal swelling? No. Pain Score  0 *  Have you tolerated food without any problems? Yes.    Have you been able to return to your normal activities? Yes.    Do you have any questions about your discharge instructions: Diet   No. Medications  No. Follow up visit  No.  Do you have questions or concerns about your Care? No.  Actions: * If pain score is 4 or above: No action needed, pain <4.

## 2022-04-07 ENCOUNTER — Encounter: Payer: Self-pay | Admitting: Gastroenterology

## 2022-04-12 ENCOUNTER — Other Ambulatory Visit (HOSPITAL_COMMUNITY): Payer: Self-pay

## 2022-04-16 ENCOUNTER — Encounter (HOSPITAL_COMMUNITY): Payer: Self-pay | Admitting: Emergency Medicine

## 2022-04-16 ENCOUNTER — Ambulatory Visit (HOSPITAL_COMMUNITY)
Admission: EM | Admit: 2022-04-16 | Discharge: 2022-04-16 | Disposition: A | Discharge status: Home / Self Care | Payer: 59 | Attending: Internal Medicine | Admitting: Internal Medicine

## 2022-04-16 ENCOUNTER — Ambulatory Visit (INDEPENDENT_AMBULATORY_CARE_PROVIDER_SITE_OTHER): Payer: 59

## 2022-04-16 DIAGNOSIS — R55 Syncope and collapse: Secondary | ICD-10-CM | POA: Diagnosis not present

## 2022-04-16 DIAGNOSIS — S61412A Laceration without foreign body of left hand, initial encounter: Secondary | ICD-10-CM

## 2022-04-16 MED ORDER — ONDANSETRON HCL 4 MG/2ML IJ SOLN
INTRAMUSCULAR | Status: AC
  Filled 2022-04-16: qty 2

## 2022-04-16 MED ORDER — AMMONIA AROMATIC IN INHA
RESPIRATORY_TRACT | Status: AC
  Filled 2022-04-16: qty 10

## 2022-04-16 MED ORDER — ONDANSETRON HCL 4 MG/2ML IJ SOLN
4.0000 mg | Freq: Once | INTRAMUSCULAR | Status: AC
  Administered 2022-04-16: 4 mg via INTRAMUSCULAR

## 2022-04-16 NOTE — ED Triage Notes (Signed)
Pt reports a laceration on her left hand. States she was sharpening a knife and cut her hand.  Bleeding controlled at this time.

## 2022-04-16 NOTE — ED Provider Notes (Signed)
[place]    CSN: [id number] Arrival date & time: 04/16/22  1433      History   Chief Complaint Chief Complaint  Patient presents with   Left hand laceration     HPI Leah Cain is a 51 y.o. female.   Patient presents to urgent care for evaluation of a laceration slightly proximal to her left pointer finger PIP joint after she was sharpening a kitchen knife this afternoon. Patient and husband are confident that her last tetanus shot was less than 5 years ago. Patient is reporting "greater than 10" pain on a scale of 0-10 to the site of laceration at this time. Bleeding is controlled at this time. Patient is not on blood thinning medications.  No attempt of over-the-counter medications for pain prior to arrival at urgent care.  Husband is requesting "something strong" for patient's pain.  States that he would like a prescription for oxycodone for patient to take for her laceration pain. Husband states patient "will be in too much pain" if she doesn't have oxycodone because she has "eczema, arthritis to her back, and now this cut to her hand". Husband states patient takes 1/2 oxycodone tablet daily for pain and does not get this from a pain clinic. She "only has 1 more oxycodone pill". Patient and husband conversing and state that "tramadol isn't strong enough".  She has not had any medication for her pain prior to arrival to urgent care. Pain is worse with flexion and extension of second digit to left hand. She is able to make a grip motion with her left hand.   Patient speaks minimal [place] and husband translating for her. Offered medical interpreter, patient declines.      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 pylori test 12/26/2013   IUD retained ([place]) 09/19/2013   Postprandial epigastric pain 09/19/2013   Postprandial RUQ pain 09/19/2013    Past Surgical History:  Procedure Laterality Date   cervical prolapse  2006    OB History     Gravida  4   Para  3   Term      Preterm      AB  1   Living         SAB  1   IAB      Ectopic      Multiple      Live Births  3            Home Medications    Prior to Admission medications   Medication Sig Start Date End Date Taking? Authorizing Provider  acetaminophen (TYLENOL) 500 MG tablet Take 500 mg by mouth every 6 (six) hours as needed for moderate pain.    [provider]  gabapentin (NEURONTIN) 300 MG capsule TAKE 2 CAPSULES BY MOUTH 3 TIMES DAILY 01/07/21 01/07/22  Long, Scott, PA-C  Oxycodone HCl 10 MG TABS TAKE 1 TO 2 TABLETS BY MOUTH EVERY NIGHT AT BEDTIME AS NEEDED FOR SEVERE PAIN 05/15/21     pantoprazole (PROTONIX) 40 MG tablet Take 1 tablet by mouth 2 times daily before a meal. 02/10/22 03/27/22  Vicie Mutters  R, PA-C  pregabalin (LYRICA) 150 MG capsule Take 1 capsule by mouth twice a day 05/15/21       Family History Family History  Problem Relation Age of Onset   Stroke Father    Colon cancer Neg Hx    Esophageal cancer Neg Hx    Stomach cancer Neg Hx     Social History Social History   Tobacco Use   Smoking status: Never   Smokeless tobacco: Never  Substance Use Topics   Alcohol use: No   Drug use: No     Allergies   Patient has no known allergies.   Review of Systems Review of Systems Per HPI  Physical Exam Triage Vital Signs ED Triage Vitals  Enc Vitals Group     BP 04/16/22 1558 127/84     Pulse Rate 04/16/22 1558 68     Resp 04/16/22 1558 19     Temp 04/16/22 1558 98 F (36.7 C)     Temp Source 04/16/22 1558 Oral     SpO2 04/16/22 1558 96 %     Weight --      Height --      Head Circumference --      Peak Flow --       Pain Score 04/16/22 1600 9     Pain Loc --      Pain Edu? --      Excl. in [place]? --    No data found.  Updated Vital Signs BP 127/84 (BP Location: Right Arm)   Pulse 68   Temp 98 F (36.7 C) (Oral)   Resp 19   SpO2 96%   Visual Acuity Right Eye Distance:   Left Eye Distance:   Bilateral Distance:    Right Eye Near:   Left Eye Near:    Bilateral Near:     Physical Exam Vitals and nursing note reviewed.  Constitutional:      Appearance: Normal appearance. She is not ill-appearing or toxic-appearing.     Comments: Very pleasant patient sitting on exam in position of comfort table in no acute distress.   HENT:     Head: Normocephalic and atraumatic.     Right Ear: Hearing and external ear normal.     Left Ear: Hearing and external ear normal.     Nose: Nose normal.     Mouth/Throat:     Lips: Pink.     Mouth: Mucous membranes are moist.  Eyes:     General: Lids are normal. Vision grossly intact. Gaze aligned appropriately.     Extraocular Movements: Extraocular movements intact.     Conjunctiva/sclera: Conjunctivae normal.  Pulmonary:     Effort: Pulmonary effort is normal.  Abdominal:     Palpations: Abdomen is soft.  Musculoskeletal:     Cervical back: Neck supple.  Skin:    General: Skin is warm and dry.     Capillary Refill: Capillary refill takes less than 2 seconds.     Findings: Laceration present. No rash.     Comments: 1.5-2cm laceration near the left second finger PIP joint. Decreased range of motion with flexion and extension of the second digit due to pain. Normal coloration to finger. Capillary refill less than 3. No drainage or bleeding from wound. See image below for further detail.   Neurological:     General: No focal deficit present.     Mental Status: She is alert and oriented to person, place, and time. Mental status is at  baseline.     Cranial Nerves: No dysarthria or facial asymmetry.     Gait: Gait is intact.  Psychiatric:        Mood and  Affect: Mood normal.        Speech: Speech normal.        Behavior: Behavior normal.        Thought Content: Thought content normal.        Judgment: Judgment normal.         UC Treatments / Results  Labs (all labs ordered are listed, but only abnormal results are displayed) Labs Reviewed - No data to display  EKG   Radiology DG Hand Complete Left  Result Date: 04/16/2022 CLINICAL DATA:  Laceration left hand EXAM: LEFT HAND - COMPLETE 3+ VIEW COMPARISON:  None Available. FINDINGS: No acute osseous finding, fracture, or joint abnormality. Soft tissue gauze noted between the thumb and index finger. No radiopaque foreign body. Radiopaque ring on the left fourth finger. IMPRESSION: No acute osseous finding Electronically Signed   By: Jerilynn Mages.  Shick M.D.   On: 04/16/2022 17:03    Procedures Laceration Repair  Date/Time: 04/16/2022 5:00 PM  Performed by: Talbot Grumbling, FNP Authorized by: Talbot Grumbling, FNP   Consent:    Consent obtained:  Verbal   Consent given by:  Patient and spouse   Risks, benefits, and alternatives were discussed: yes     Risks discussed:  Infection, pain, retained foreign body, tendon damage, vascular damage, poor wound healing, poor cosmetic result, need for additional repair and nerve damage   Alternatives discussed:  No treatment Universal protocol:    Procedure explained and questions answered to patient or proxy's satisfaction: yes     Patient identity confirmed:  Verbally with patient Anesthesia:    Anesthesia method:  Local infiltration   Local anesthetic:  Lidocaine 1% w/o epi Laceration details:    Location:  Hand   Hand location:  L hand, dorsum   Length (cm):  2   Depth (mm):  0.3 Pre-procedure details:    Preparation:  Imaging obtained to evaluate for foreign bodies (Negative left hand x-ray) Exploration:    Wound exploration: entire depth of wound visualized   Treatment:    Area cleansed with:  Soap and water and  povidone-iodine   Amount of cleaning:  Standard   Irrigation solution:  Tap water Skin repair:    Repair method:  Sutures   Suture size:  5-0   Suture material:  Prolene   Suture technique:  Simple interrupted   Number of sutures:  4 Approximation:    Approximation:  Close Repair type:    Repair type:  Simple Post-procedure details:    Dressing:  Non-adherent dressing   Procedure completion:  Tolerated  (including critical care time)  Medications Ordered in UC Medications  ondansetron (ZOFRAN) injection 4 mg (4 mg Intramuscular Given 04/16/22 1748)    Initial Impression / Assessment and Plan / UC Course  I have reviewed the triage vital signs and the nursing notes.  Pertinent labs & imaging results that were available during my care of the patient were reviewed by me and considered in my medical decision making (see chart for details).   1. Laceration of left hand without foreign body, initial encounter Laceration repaired successfully. See procedure note above for further detail. Wound cleansed and dressed with non-adherent gauze and coban wrap. Instructed patient to avoid getting wound wet for 24 hours after procedure. She may wet wound tomorrow  and pat it dry with clean towel. Instructed patient not to rub wound as this could cause bleeding. No ointments, powders, or lotions to the wound for at least 1 week as wound heals. She is to return for suture removal in 7 days. Sooner return if notices signs of infection.   PDMP reviewed and patient recently filled prescription for 90 days of oxycodone '15mg'$  tablets on Mar 16, 2022. Based on how often husband told me she takes them, she should still have plenty left. Husband states she bleeds when she takes ibuprofen, so she may take tylenol 1,'000mg'$  every 6 hours as needed for pain associated with laceration repair.   2. Vasovagal syncope While suturing left hand, patient became pale and lost consciousness for approximately 2-3 minutes.  Procedure immediately stopped, suture was cut from hand to prevent accidental dirty needle stick, and staff was called for help to patient's room. Staff arrived promptly and she was placed in a reclined position and legs were elevated. Vital signs remained stable throughout entire event. Patient had consistent and palpable pulse to the radial and carotid arteries for entirety of syncopal event. Able to maintain her own airway for entirety of syncopal event. No seizure activity seen. Oxygen given via non-rebreather while she was unconscious. Nurses assisted to monitor patient during syncopal event. Attempted to place alcohol under patient's nose to help her regain consciousness without success. Ammonia placed under patient's nose and she began to regain consciousness. Patient reported nausea while she was still slightly confused and regaining consciousness. Verbal order given for '4mg'$  zofran IM given. Patient slowly began to wakeup and become alert and aware of event.   Patient able to tolerate remainder of laceration repair procedure and remaining 2 sutures were placed without difficulty or repeat syncopal episode. Patient is not a diabetic and does not have a history of cardiac event. Has never passed out at the sight of blood in the past. She had recently eaten and reported improvement in nausea prior to leaving urgent care. Discharged from urgent care with stable neurologic exam and alert and oriented x4.   Discussed physical exam and available lab work findings in clinic with patient.  Counseled patient regarding appropriate use of medications and potential side effects for all medications recommended or prescribed today. Discussed red flag signs and symptoms of worsening condition,when to call the PCP office, return to urgent care, and when to seek higher level of care in the emergency department. Patient verbalizes understanding and agreement with plan. All questions answered. Patient discharged in stable  condition.   Final Clinical Impressions(s) / UC Diagnoses   Final diagnoses:  Laceration of left hand without foreign body, initial encounter  Vasovagal syncope     Discharge Instructions      We sewed up your laceration today. No need for antibiotic today. Check wound for signs of infection (swelling, white drainage, and redness). Keep laceration dry for the next 24 hours.  Change the dressing twice daily for the next 7 days. Return to urgent care in the next 7 to 10 days.   You recently filled your oxycodone prescription on Mar 16, 2022 with 90 pills. You should have enough of this at home to be able to cover you for your pain related to your laceration based on how often you take it.   You may use tylenol over the counter for pain.  Come back to urgent care in 7 days to have the sutures removed.   If you develop any new  or worsening symptoms or do not improve in the next 2 to 3 days, please return.  If your symptoms are severe, please go to the emergency room.  Follow-up with your primary care provider for further evaluation and management of your symptoms as well as ongoing wellness visits.  I hope you feel better!     ED Prescriptions   None    PDMP not reviewed this encounter.   Talbot Grumbling, [place] 04/19/22 1235

## 2022-04-16 NOTE — Discharge Instructions (Addendum)
We sewed up your laceration today. No need for antibiotic today. Check wound for signs of infection (swelling, white drainage, and redness). Keep laceration dry for the next 24 hours.  Change the dressing twice daily for the next 7 days. Return to urgent care in the next 7 to 10 days.   You recently filled your oxycodone prescription on Mar 16, 2022 with 90 pills. You should have enough of this at home to be able to cover you for your pain related to your laceration based on how often you take it.   You may use tylenol over the counter for pain.  Come back to urgent care in 7 days to have the sutures removed.   If you develop any new or worsening symptoms or do not improve in the next 2 to 3 days, please return.  If your symptoms are severe, please go to the emergency room.  Follow-up with your primary care provider for further evaluation and management of your symptoms as well as ongoing wellness visits.  I hope you feel better!

## 2022-04-27 ENCOUNTER — Ambulatory Visit (HOSPITAL_COMMUNITY)
Admission: RE | Admit: 2022-04-27 | Discharge: 2022-04-27 | Disposition: A | Discharge status: Home / Self Care | Payer: 59 | Source: Ambulatory Visit

## 2022-04-27 ENCOUNTER — Encounter (HOSPITAL_COMMUNITY): Payer: Self-pay

## 2022-04-27 NOTE — ED Triage Notes (Signed)
Pt presents to UC for suture removal on left hand. Denies any other complaints.

## 2022-05-06 ENCOUNTER — Other Ambulatory Visit (HOSPITAL_COMMUNITY): Payer: Self-pay

## 2022-06-25 ENCOUNTER — Encounter (HOSPITAL_COMMUNITY): Payer: Self-pay | Admitting: Emergency Medicine

## 2022-06-25 ENCOUNTER — Ambulatory Visit (HOSPITAL_COMMUNITY)
Admission: EM | Admit: 2022-06-25 | Discharge: 2022-06-25 | Disposition: A | Discharge status: Home / Self Care | Payer: Commercial Managed Care - HMO

## 2022-06-25 ENCOUNTER — Other Ambulatory Visit: Payer: Self-pay

## 2022-06-25 DIAGNOSIS — H1132 Conjunctival hemorrhage, left eye: Secondary | ICD-10-CM | POA: Diagnosis not present

## 2022-06-25 NOTE — ED Provider Notes (Signed)
[facility]    CSN: [id number] Arrival date & time: 06/25/22  1826      History   Chief Complaint Chief Complaint  Patient presents with   Eye Problem    HPI Tahjae Durr is a 51 y.o. female.   51 year old female accompanied by her husband who is providing translation for her, presents with left eye redness that she noticed today. She had been exercising today and noticed the lateral aspect of her left eye appeared to have blood present. No other redness, swelling or discharge from her left eye. Right eye not affected. No change in vision. No pain. No headaches or other symptoms. No previous episodes. She has placed OTC Clear Eye drops today with no change. Does wear glasses but no contacts. No other current chronic health issues. Does take Citrucel as needed for occasional constipation.   The history is provided by the patient and the spouse. The history is limited by a language barrier (Husband very fluent in [place] and provided translation.).    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 pylori test 12/26/2013   IUD retained ([place]) 09/19/2013   Postprandial epigastric pain 09/19/2013   Postprandial RUQ pain 09/19/2013    Past Surgical History:  Procedure Laterality Date   cervical prolapse  2006    OB History     Gravida  4   Para  3   Term      Preterm      AB  1   Living         SAB  1   IAB      Ectopic      Multiple      Live Births  3            Home Medications    Prior to Admission medications   Medication Sig Start Date End Date Taking? Authorizing Provider  acetaminophen (TYLENOL) 500 MG tablet Take 500 mg by mouth every 6 (six)  hours as needed for moderate pain.    [provider]  Oxycodone HCl 10 MG TABS TAKE 1 TO 2 TABLETS BY MOUTH EVERY NIGHT AT BEDTIME AS NEEDED FOR SEVERE PAIN Patient not taking: Reported on 06/25/2022 05/15/21     pantoprazole (PROTONIX) 40 MG tablet Take 1 tablet by mouth 2 times daily before a meal. Patient not taking: Reported on 06/25/2022 02/10/22 03/27/22  Vladimir Crofts, PA-C    Family History Family History  Problem Relation Age of Onset   Stroke Father    Colon cancer Neg Hx    Esophageal cancer Neg Hx    Stomach cancer Neg Hx     Social History Social History   Tobacco Use   Smoking status: Never   Smokeless tobacco: Never  Vaping Use   Vaping Use: Never used  Substance Use Topics   Alcohol use: No   Drug use: No     Allergies   Patient has no known allergies.   Review of Systems Review of Systems  Constitutional:  Negative for activity change, appetite change, chills, diaphoresis, fatigue and fever.  HENT:  Negative for congestion, facial  swelling, postnasal drip and sneezing.   Eyes:  Positive for redness (left outer eye). Negative for photophobia, pain, discharge, itching and visual disturbance.  Musculoskeletal:  Negative for arthralgias and myalgias.  Skin:  Negative for color change and rash.  Allergic/Immunologic: Negative for environmental allergies, food allergies and immunocompromised state.  Neurological:  Negative for dizziness, tremors, seizures, syncope, facial asymmetry, speech difficulty, weakness, light-headedness, numbness and headaches.  Hematological:  Negative for adenopathy. Does not bruise/bleed easily.     Physical Exam Triage Vital Signs ED Triage Vitals  Enc Vitals Group     BP 06/25/22 1842 114/80     Pulse Rate 06/25/22 1842 72     Resp 06/25/22 1842 18     Temp 06/25/22 1842 98.3 F (36.8 C)     Temp Source 06/25/22 1842 Oral     SpO2 06/25/22 1842 97 %     Weight --      Height --      Head Circumference --       Peak Flow --      Pain Score 06/25/22 1839 0     Pain Loc --      Pain Edu? --      Excl. in [place]? --    No data found.  Updated Vital Signs BP 114/80 (BP Location: Left Arm)   Pulse 72   Temp 98.3 F (36.8 C) (Oral)   Resp 18   SpO2 97%   Visual Acuity Right Eye Distance:   Left Eye Distance:   Bilateral Distance:    Right Eye Near:   Left Eye Near:    Bilateral Near:     Physical Exam Vitals and nursing note reviewed.  Constitutional:      General: She is awake. She is not in acute distress.    Appearance: She is well-developed and well-groomed.     Comments: She is sitting comfortably on the exam table in no acute distress.   HENT:     Head: Normocephalic and atraumatic.     Right Ear: Hearing and external ear normal.     Left Ear: Hearing and external ear normal.     Nose: Nose normal.     Mouth/Throat:     Lips: Pink.  Eyes:     General: Lids are normal. Lids are everted, no foreign bodies appreciated. Vision grossly intact. Gaze aligned appropriately. No visual field deficit or scleral icterus.       Right eye: No foreign body, discharge or hordeolum.        Left eye: No foreign body, discharge or hordeolum.     Extraocular Movements: Extraocular movements intact.     Conjunctiva/sclera:     Right eye: Right conjunctiva is not injected. No chemosis, exudate or hemorrhage.    Left eye: Left conjunctiva is not injected. Hemorrhage present. No chemosis or exudate.    Pupils: Pupils are equal, round, and reactive to light.      Comments: Subconjunctival hemorrhage present in lateral outer aspect of left eye. Does not affect iris or pupil. Small clot present in upper area of sclera. No pain or tenderness. No foreign bodies seen or any discharge.   Cardiovascular:     Rate and Rhythm: Normal rate.  Pulmonary:     Effort: Pulmonary effort is normal.  Musculoskeletal:     Cervical back: Normal range of motion and neck supple.  Lymphadenopathy:     Cervical: No  cervical adenopathy.  Skin:    General:  Skin is warm and dry.     Findings: No erythema or rash.  Neurological:     General: No focal deficit present.     Mental Status: She is alert and oriented to person, place, and time.  Psychiatric:        Mood and Affect: Mood normal.        Behavior: Behavior normal. Behavior is cooperative.      UC Treatments / Results  Labs (all labs ordered are listed, but only abnormal results are displayed) Labs Reviewed - No data to display  EKG   Radiology No results found.  Procedures Procedures (including critical care time)  Medications Ordered in UC Medications - No data to display  Initial Impression / Assessment and Plan / UC Course  I have reviewed the triage vital signs and the nursing notes.  Pertinent labs & imaging results that were available during my care of the patient were reviewed by me and considered in my medical decision making (see chart for details).     Discussed with patient and husband that she has a subconjunctival hemorrhage which is not concerning. Reassurance provided. Information provided in English instead of Arabic- husband indicated that he was comfortable reading and understanding written English. Discussed that any straining or lifting/exercise could spontaneously cause this condition. Blood should slowly reabsorb over the next few days to sometimes a week or two. If any pain, increase in blood present, especially if it affects her iris area or any vision changes occurs, go to the ER or an EYE doctor ASAP. Otherwise, follow-up as needed.  Final Clinical Impressions(s) / UC Diagnoses   Final diagnoses:  Subconjunctival hemorrhage of left eye     Discharge Instructions      Recommend continue to monitor your eye. If any pain develops, bleeding goes into the colored part of her eye or any vision changes occur, go to the ER or Eye doctor. Otherwise, follow-up as needed.     ED Prescriptions   None     PDMP not reviewed this encounter.   Katy Apo, NP 06/26/22 1301

## 2022-06-25 NOTE — Discharge Instructions (Addendum)
Recommend continue to monitor your eye. If any pain develops, bleeding goes into the colored part of her eye or any vision changes occur, go to the ER or Eye doctor. Otherwise, follow-up as needed.

## 2022-06-25 NOTE — ED Triage Notes (Signed)
Left eye with subconjunctival hemorrhage noticed today.  Nothing felt in eye, has not hit her head.  Patient is wearing glasses.  Patient does not wear contacts.  No difference in vision

## 2022-07-01 ENCOUNTER — Encounter (HOSPITAL_COMMUNITY): Payer: Self-pay | Admitting: *Deleted

## 2022-07-01 ENCOUNTER — Ambulatory Visit (INDEPENDENT_AMBULATORY_CARE_PROVIDER_SITE_OTHER): Payer: Commercial Managed Care - HMO

## 2022-07-01 ENCOUNTER — Ambulatory Visit (HOSPITAL_COMMUNITY)
Admission: EM | Admit: 2022-07-01 | Discharge: 2022-07-01 | Disposition: A | Discharge status: Home / Self Care | Payer: Commercial Managed Care - HMO | Attending: Family Medicine | Admitting: Family Medicine

## 2022-07-01 ENCOUNTER — Other Ambulatory Visit (HOSPITAL_COMMUNITY): Payer: Self-pay

## 2022-07-01 DIAGNOSIS — M79605 Pain in left leg: Secondary | ICD-10-CM

## 2022-07-01 DIAGNOSIS — M25562 Pain in left knee: Secondary | ICD-10-CM

## 2022-07-01 MED ORDER — KETOROLAC TROMETHAMINE 30 MG/ML IJ SOLN
INTRAMUSCULAR | Status: AC
  Filled 2022-07-01: qty 1

## 2022-07-01 MED ORDER — KETOROLAC TROMETHAMINE 30 MG/ML IJ SOLN
30.0000 mg | Freq: Once | INTRAMUSCULAR | Status: AC
  Administered 2022-07-01: 30 mg via INTRAMUSCULAR

## 2022-07-01 MED ORDER — GABAPENTIN 300 MG PO CAPS
300.0000 mg | ORAL_CAPSULE | Freq: Three times a day (TID) | ORAL | 0 refills | Status: AC
  Filled 2022-07-01: qty 90, 30d supply, fill #0

## 2022-07-01 MED ORDER — KETOROLAC TROMETHAMINE 10 MG PO TABS
10.0000 mg | ORAL_TABLET | Freq: Four times a day (QID) | ORAL | 0 refills | Status: DC | PRN
  Filled 2022-07-01: qty 20, 30d supply, fill #0

## 2022-07-01 NOTE — Discharge Instructions (Addendum)
Your x-ray was normal  You have been given a shot of Toradol 30 mg today.   You can take ketorolac 10 mg tablets--1 tablet every 6 hours as needed for pain.  This medication is only meant for short-term use  Stop taking pregabalin  Begin taking gabapentin 300 mg--you can take it is much as 3 times daily.  Please contact your primary care office and let them know that this medication has been started for you and you need follow-up for the pain she

## 2022-07-01 NOTE — ED Provider Notes (Signed)
[facility]    CSN: [id number] Arrival date & time: 07/01/22  1204      History   Chief Complaint Chief Complaint  Patient presents with   Leg Pain    HPI Leah Cain is a 51 y.o. female.    Leg Pain  Here for left leg pain for the last month or so.  At first it was mainly in her posterior thigh and hurt when she flexed her leg a lot.  Now it is more behind her left knee and around into the anterior left knee.  No trauma or fall.  She does take oxycodone off-and-on for arthritis pain.  She has been prescribed pregabalin but she has ended up not taking it all the time because of sleepiness.  She used to take gabapentin with good effect.  Her husband is translating for any states she recently took one of her old tramadol pills and it helped a lot.  There are no duplications on PMP.  She is prescribed oxycodone and pregabalin by her family medicine practice.  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 pylori test 12/26/2013   IUD retained ([place]) 09/19/2013   Postprandial epigastric pain 09/19/2013   Postprandial RUQ pain 09/19/2013    Past Surgical History:  Procedure Laterality Date   cervical prolapse  2006    OB History     Gravida  4   Para  3   Term      Preterm      AB  1   Living         SAB  1   IAB      Ectopic      Multiple      Live Births  3            Home Medications    Prior to Admission medications   Medication Sig Start Date End Date Taking? Authorizing Provider  gabapentin (NEURONTIN) 300 MG capsule Take 1 capsule (300 mg total) by mouth 3 (three) times daily. 07/01/22  Yes Bodee Lafoe, Gwenlyn Perking, MD  ketorolac (TORADOL) 10  MG tablet Take 1 tablet (10 mg total) by mouth every 6 (six) hours as needed (pain). 07/01/22  Yes Barrett Henle, MD  acetaminophen (TYLENOL) 500 MG tablet Take 500 mg by mouth every 6 (six) hours as needed for moderate pain.    [provider]  pantoprazole (PROTONIX) 40 MG tablet Take 1 tablet by mouth 2 times daily before a meal. Patient not taking: Reported on 06/25/2022 02/10/22 03/27/22  Vladimir Crofts, PA-C    Family History Family History  Problem Relation Age of Onset   Stroke Father    Colon cancer Neg Hx    Esophageal cancer Neg Hx    Stomach cancer Neg Hx     Social History Social History   Tobacco Use   Smoking status: Never   Smokeless tobacco: Never  Vaping Use   Vaping Use: Never used  Substance Use Topics   Alcohol use: No   Drug use: No     Allergies   Patient has no known allergies.   Review of Systems  Review of Systems   Physical Exam Triage Vital Signs ED Triage Vitals  Enc Vitals Group     BP 07/01/22 1325 109/76     Pulse Rate 07/01/22 1325 69     Resp 07/01/22 1325 18     Temp 07/01/22 1325 98.4 F (36.9 C)     Temp Source 07/01/22 1325 Oral     SpO2 07/01/22 1325 96 %     Weight --      Height --      Head Circumference --      Peak Flow --      Pain Score 07/01/22 1323 9     Pain Loc --      Pain Edu? --      Excl. in [place]? --    No data found.  Updated Vital Signs BP 109/76 (BP Location: Left Arm)   Pulse 69   Temp 98.4 F (36.9 C) (Oral)   Resp 18   SpO2 96%   Visual Acuity Right Eye Distance:   Left Eye Distance:   Bilateral Distance:    Right Eye Near:   Left Eye Near:    Bilateral Near:     Physical Exam   UC Treatments / Results  Labs (all labs ordered are listed, but only abnormal results are displayed) Labs Reviewed - No data to display  EKG   Radiology DG Knee 2 Views Left  Result Date: 07/01/2022 CLINICAL DATA:  Knee pain EXAM: LEFT KNEE - 1-2 VIEW COMPARISON:  None Available.  FINDINGS: No evidence of fracture, dislocation, or joint effusion. No evidence of arthropathy or other focal bone abnormality. Soft tissues are unremarkable. IMPRESSION: Negative. Electronically Signed   By: Jerilynn Mages.  Shick M.D.   On: 07/01/2022 14:08    Procedures Procedures (including critical care time)  Medications Ordered in UC Medications  ketorolac (TORADOL) 30 MG/ML injection 30 mg (has no administration in time range)    Initial Impression / Assessment and Plan / UC Course  I have reviewed the triage vital signs and the nursing notes.  Pertinent labs & imaging results that were available during my care of the patient were reviewed by me and considered in my medical decision making (see chart for details).    X-ray is negative. We are going to give her a shot of Toradol here and a few days worth of Toradol for pain.  Also I am prescribing some gabapentin.  I can see that she used to take gabapentin 300 mg 2 capsules 3 times daily.  I am going to prescribe it for 1 capsule 3 times a day and have her follow-up with her primary care.  They are aware that she should not take this with the pregabalin.  Also discussed with him that I would prefer a prescription like tramadol to come from the people that usually are prescribing her other controlled substances.       Final Clinical Impressions(s) / UC Diagnoses   Final diagnoses:  Left leg pain     Discharge Instructions      Your x-ray was normal  You have been given a shot of Toradol 30 mg today.   You can take ketorolac 10 mg tablets--1 tablet every 6 hours as needed for pain.  This medication is only meant for short-term use  Stop taking pregabalin  Begin taking gabapentin 300 mg--you can take it is much as 3 times daily.  Please contact your primary care office and let them know that  this medication has been started for you and you need follow-up for the pain she     ED Prescriptions     Medication Sig Dispense Auth.  Provider   ketorolac (TORADOL) 10 MG tablet Take 1 tablet (10 mg total) by mouth every 6 (six) hours as needed (pain). 20 tablet Starlin Steib, Gwenlyn Perking, MD   gabapentin (NEURONTIN) 300 MG capsule Take 1 capsule (300 mg total) by mouth 3 (three) times daily. 90 capsule Baylen Dea, Gwenlyn Perking, MD      I have reviewed the PDMP during this encounter.   Barrett Henle, MD 07/01/22 1435

## 2022-07-01 NOTE — ED Triage Notes (Signed)
Pt states that left leg has bene hurting X 1 month. Pain mostly in the back of uper leg and radiates down to lower leg. She was taking advil for the pain without relief. She use to take gabapentin in the past for hand pain but she is out.

## 2022-08-02 ENCOUNTER — Other Ambulatory Visit (HOSPITAL_COMMUNITY): Payer: Self-pay

## 2022-08-02 MED ORDER — PREGABALIN 150 MG PO CAPS
150.0000 mg | ORAL_CAPSULE | Freq: Two times a day (BID) | ORAL | 2 refills | Status: DC
  Filled 2022-08-02: qty 60, 30d supply, fill #0
  Filled 2022-09-05: qty 60, 30d supply, fill #1
  Filled 2022-10-03: qty 60, 30d supply, fill #2

## 2022-08-04 ENCOUNTER — Other Ambulatory Visit (HOSPITAL_COMMUNITY): Payer: Self-pay

## 2022-08-14 NOTE — Progress Notes (Deleted)
Office Visit Note  Patient: Leah Cain             Date of Birth: 1971/01/11           MRN: [id number]             PCP: [facility] Referring: Vladimir Crofts, PA-C Visit Date: 08/27/2022 Occupation: '@GUAROCC'$ @  Subjective:  No chief complaint on file.   History of Present Illness: Leah Cain is a 51 y.o. female ***   Activities of Daily Living:  Patient reports morning stiffness for *** {minute/hour:19697}.   Patient {ACTIONS;DENIES/REPORTS:21021675::"Denies"} nocturnal pain.  Difficulty dressing/grooming: {ACTIONS;DENIES/REPORTS:21021675::"Denies"} Difficulty climbing stairs: {ACTIONS;DENIES/REPORTS:21021675::"Denies"} Difficulty getting out of chair: {ACTIONS;DENIES/REPORTS:21021675::"Denies"} Difficulty using hands for taps, buttons, cutlery, and/or writing: {ACTIONS;DENIES/REPORTS:21021675::"Denies"}  No Rheumatology ROS completed.   PMFS History:  Patient Active Problem List   Diagnosis Date Noted   Cervical disc disorder with radiculopathy of cervical region 11/07/2015   Chronic low back pain 06/15/2014   Positive H. pylori test 12/26/2013   IUD retained ([place]) 09/19/2013   Postprandial epigastric pain 09/19/2013   Postprandial RUQ pain 09/19/2013    Past Medical History:  Diagnosis Date   Arthritis    Eczema    Foreign body in maxillary sinus    Metalic object   GERD (gastroesophageal reflux disease)     Family History  Problem Relation Age of Onset   Stroke Father    Colon cancer Neg Hx    Esophageal cancer Neg Hx    Stomach cancer Neg Hx    Past Surgical History:  Procedure Laterality Date   cervical prolapse  2006   Social History   Social History Narrative   Not on file    There is no immunization history on file for this patient.   Objective: Vital Signs: There were no vitals taken for this visit.   Physical Exam   Musculoskeletal Exam: ***  CDAI Exam: CDAI Score: -- Patient Global: --; Provider Global:  -- Swollen: --; Tender: -- Joint Exam 08/27/2022   No joint exam has been documented for this visit   There is currently no information documented on the homunculus. Go to the Rheumatology activity and complete the homunculus joint exam.  Investigation: No additional findings.  Imaging: No results found.  Recent Labs: Lab Results  Component Value Date   WBC 6.3 02/05/2022   HGB 12.8 02/05/2022   PLT 303.0 02/05/2022   NA 139 02/05/2022   K 3.6 02/05/2022   CL 106 02/05/2022   CO2 28 02/05/2022   GLUCOSE 100 (H) 02/05/2022   BUN 15 02/05/2022   CREATININE 0.59 02/05/2022   BILITOT 0.5 02/05/2022   ALKPHOS 83 02/05/2022   AST 17 02/05/2022   ALT 20 02/05/2022   PROT 7.0 02/05/2022   ALBUMIN 4.3 02/05/2022   CALCIUM 9.1 02/05/2022   GFRAA >60 09/25/2018    Speciality Comments: No specialty comments available.  Procedures:  No procedures performed Allergies: Patient has no known allergies.   Assessment / Plan:     Visit Diagnoses: Elevated C-reactive protein (CRP) - 02/05/22: CRP 13.350, IgA 169, tTG<1  Abdominal bloating  Cervical disc disorder with radiculopathy of cervical region  Positive H. pylori test  Postprandial RUQ pain  Intrauterine device (IUD) migration, subsequent encounter  Orders: No orders of the defined types were placed in this encounter.  No orders of the defined types were placed in this encounter.   Face-to-face time spent with patient was *** minutes. Greater than  50% of time was spent in counseling and coordination of care.  Follow-Up Instructions: No follow-ups on file.   Ofilia Neas, PA-C  Note - This record has been created using Dragon software.  Chart creation errors have been sought, but may not always  have been located. Such creation errors do not reflect on  the standard of medical care.

## 2022-08-15 ENCOUNTER — Other Ambulatory Visit (HOSPITAL_COMMUNITY): Payer: Self-pay

## 2022-08-15 MED ORDER — HYDROCODONE-ACETAMINOPHEN 5-325 MG PO TABS
1.0000 | ORAL_TABLET | ORAL | 0 refills | Status: DC
  Filled 2022-08-15: qty 10, 1d supply, fill #0

## 2022-08-22 ENCOUNTER — Encounter: Payer: Self-pay | Admitting: *Deleted

## 2022-08-27 ENCOUNTER — Ambulatory Visit: Payer: 59 | Admitting: Rheumatology

## 2022-08-27 DIAGNOSIS — R140 Abdominal distension (gaseous): Secondary | ICD-10-CM

## 2022-08-27 DIAGNOSIS — T8332XD Displacement of intrauterine contraceptive device, subsequent encounter: Secondary | ICD-10-CM

## 2022-08-27 DIAGNOSIS — A048 Other specified bacterial intestinal infections: Secondary | ICD-10-CM

## 2022-08-27 DIAGNOSIS — R7982 Elevated C-reactive protein (CRP): Secondary | ICD-10-CM

## 2022-08-27 DIAGNOSIS — M5010 Cervical disc disorder with radiculopathy, unspecified cervical region: Secondary | ICD-10-CM

## 2022-08-27 DIAGNOSIS — R1011 Right upper quadrant pain: Secondary | ICD-10-CM

## 2022-09-05 ENCOUNTER — Other Ambulatory Visit (HOSPITAL_COMMUNITY): Payer: Self-pay

## 2022-09-12 ENCOUNTER — Ambulatory Visit (HOSPITAL_COMMUNITY)
Admission: EM | Admit: 2022-09-12 | Discharge: 2022-09-12 | Disposition: A | Discharge status: Home / Self Care | Payer: Commercial Managed Care - HMO | Attending: Emergency Medicine | Admitting: Emergency Medicine

## 2022-09-12 ENCOUNTER — Encounter (HOSPITAL_COMMUNITY): Payer: Self-pay | Admitting: Emergency Medicine

## 2022-09-12 ENCOUNTER — Other Ambulatory Visit (HOSPITAL_COMMUNITY): Payer: Self-pay

## 2022-09-12 DIAGNOSIS — R519 Headache, unspecified: Secondary | ICD-10-CM | POA: Diagnosis not present

## 2022-09-12 DIAGNOSIS — R1013 Epigastric pain: Secondary | ICD-10-CM

## 2022-09-12 MED ORDER — AZITHROMYCIN 500 MG PO TABS
500.0000 mg | ORAL_TABLET | Freq: Every day | ORAL | 0 refills | Status: AC
  Filled 2022-09-12: qty 3, 3d supply, fill #0

## 2022-09-12 MED ORDER — ACETAMINOPHEN-CODEINE 300-30 MG PO TABS
1.0000 | ORAL_TABLET | Freq: Three times a day (TID) | ORAL | 0 refills | Status: AC | PRN
  Filled 2022-09-12: qty 9, 3d supply, fill #0

## 2022-09-12 MED ORDER — DICYCLOMINE HCL 20 MG PO TABS
20.0000 mg | ORAL_TABLET | Freq: Two times a day (BID) | ORAL | 0 refills | Status: AC
  Filled 2022-09-12: qty 10, 5d supply, fill #0

## 2022-09-12 NOTE — ED Provider Notes (Signed)
[facility]    CSN: [id number] Arrival date & time: 09/12/22  1201      History   Chief Complaint Chief Complaint  Patient presents with   Abdominal Pain    HPI Bayli Quesinberry is a 51 y.o. female.   [place] with 6 days of centralized abdominal pain beginning while flying home on 15-hour flight from [place].  Was eating top was provided by the airline prior to, began to experience diarrhea and vomiting while on flight.  Diarrhea and vomiting has resolved with last occurrence 6 days ago.  But epigastric pain has persisted, pain is constant and is described as discomfort.  Last bowel movement was 1 day ago described as normal.  Has been tolerating food and liquids.  Has not attempted treatment.    Concerned with left-sided headache beginning today at approximately 10 AM.  Pain is to the rated as severe, rated a 10 out of 10, worsened by loud noises.  Initially experienced dizziness but this has resolved.  Denies visual changes, lightheadedness, syncope, memory or speech changes, weakness.  Has attempted use of Tylenol which has been minimally effective.     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 pylori test 12/26/2013   IUD retained ([place]) 09/19/2013   Postprandial epigastric pain 09/19/2013   Postprandial RUQ pain 09/19/2013    Past Surgical History:  Procedure Laterality Date   cervical prolapse  2006    OB History     Gravida  4   Para  3   Term      Preterm      AB  1   Living         SAB  1   IAB      Ectopic      Multiple      Live Births  3            Home Medications    Prior to Admission medications   Medication Sig Start Date End  Date Taking? Authorizing Provider  acetaminophen (TYLENOL) 500 MG tablet Take 500 mg by mouth every 6 (six) hours as needed for moderate pain.    [provider]  gabapentin (NEURONTIN) 300 MG capsule Take 1 capsule (300 mg total) by mouth 3 (three) times daily. 07/01/22   Barrett Henle, MD  HYDROcodone-acetaminophen (NORCO/VICODIN) 5-325 MG tablet Take 1 - 2 tablets by mouth every 4 -  6 hours as needed for pain. 08/15/22     ketorolac (TORADOL) 10 MG tablet Take 1 tablet (10 mg total) by mouth every 6 (six) hours as needed (pain). 07/01/22   Barrett Henle, MD  pantoprazole (PROTONIX) 40 MG tablet Take 1 tablet by mouth 2 times daily before a meal. Patient not taking: Reported on 06/25/2022 02/10/22 03/27/22  Vladimir Crofts, PA-C  pregabalin (LYRICA) 150 MG capsule Take 1 capsule (150 mg total) by mouth 2 (two) times daily for chronic nerve pain. Do not discontinue this med suddenly 08/02/22       Family History Family History  Problem Relation Age of Onset   Stroke Father    Colon cancer Neg Hx  Esophageal cancer Neg Hx    Stomach cancer Neg Hx     Social History Social History   Tobacco Use   Smoking status: Never   Smokeless tobacco: Never  Vaping Use   Vaping Use: Never used  Substance Use Topics   Alcohol use: No   Drug use: No     Allergies   Patient has no known allergies.   Review of Systems Review of Systems Defer to HPI    Physical Exam Triage Vital Signs ED Triage Vitals  Enc Vitals Group     BP 09/12/22 1310 115/87     Pulse Rate 09/12/22 1310 64     Resp 09/12/22 1310 18     Temp 09/12/22 1310 98.4 F (36.9 C)     Temp src --      SpO2 09/12/22 1310 100 %     Weight --      Height --      Head Circumference --      Peak Flow --      Pain Score 09/12/22 1309 9     Pain Loc --      Pain Edu? --      Excl. in [place]? --    No data found.  Updated Vital Signs BP 115/87 (BP Location: Left Arm)   Pulse 64   Temp 98.4 F (36.9 C)    Resp 18   SpO2 100%   Visual Acuity Right Eye Distance:   Left Eye Distance:   Bilateral Distance:    Right Eye Near:   Left Eye Near:    Bilateral Near:     Physical Exam Constitutional:      Appearance: She is well-developed.  Eyes:     Extraocular Movements: Extraocular movements intact.  Pulmonary:     Effort: Pulmonary effort is normal.  Abdominal:     General: Abdomen is flat. Bowel sounds are normal.     Palpations: Abdomen is soft.     Tenderness: There is abdominal tenderness in the epigastric area. There is no right CVA tenderness, left CVA tenderness or guarding.  Skin:    General: Skin is warm and dry.  Neurological:     General: No focal deficit present.     Mental Status: She is alert and oriented to person, place, and time. Mental status is at baseline.     Cranial Nerves: No cranial nerve deficit.     Sensory: No sensory deficit.     Motor: No weakness.     Gait: Gait normal.  Psychiatric:        Mood and Affect: Mood normal.        Behavior: Behavior normal.      UC Treatments / Results  Labs (all labs ordered are listed, but only abnormal results are displayed) Labs Reviewed - No data to display  EKG   Radiology No results found.  Procedures Procedures (including critical care time)  Medications Ordered in UC Medications - No data to display  Initial Impression / Assessment and Plan / UC Course  I have reviewed the triage vital signs and the nursing notes.  Pertinent labs & imaging results that were available during my care of the patient were reviewed by me and considered in my medical decision making (see chart for details).  Epigastric abdominal pain, bad headache  Vital signs are stable and patient is in no signs of distress nontoxic-appearing, mild tenderness noted to the epigastric region, otherwise stable, accompanying symptoms have  resolved at this time however symptoms are most likely a form of gastritis due to food consumed  on the airplane, discussed with patient and husband, azithromycin 500 mg daily for 3 days prescribed as well as Bentyl, advised a bland diet and increase fluid intake and advised that if symptoms continue to persist to follow-up with her PCP for further evaluation  No abnormality noted to the neurological exam, declined Toradol and Decadron injection in office, endorses intolerance to NSAIDs and advised continued use of Tylenol, Tylenol 3 prescribed, 9 tablets to be dispensed, PDMP reviewed, low risk, advised low stimulation activities, adequate rest and good fluid intake, given strict cautions that if headache worsens in severity she will need to go to the nearest emergency department for further evaluation and management Final Clinical Impressions(s) / UC Diagnoses   Final diagnoses:  None   Discharge Instructions   None    ED Prescriptions   None    PDMP not reviewed this encounter.   Hans Eden, NP 09/12/22 1350

## 2022-09-12 NOTE — Discharge Instructions (Addendum)
For your abdomen -Tenderness is present over your stomach.,  Symptoms are most likely result of something that she ate on the plane as you initially had accompanying vomiting and diarrhea -Take azithromycin every morning for 3 days to cover for any bacteria which may be aiding to your symptoms -Take Bentyl every morning and every evening for 5 days to help reduce stomach pain -While pain is present eat a bland diet with avoidance of spicy or greasy foods to prevent further irritation -May also attempt over-the-counter Maalox or Pepto-Bismol for additional support -If your stomach pain continues to persist please follow-up with your primary doctor  For your headache -On exam there are no abnormalities neurologically -Patient has declined medication injections here in the office -You may continue use of Tylenol at home.  Would also like you attempted use of ibuprofen for management before using Tylenol 3 for severe pain you may use Tylenol 3 every 8 hours, be mindful of this will you drowsy -While headaches are present ensure that you are getting adequate rest and adequate fluid intake -Participate in low stimulation activities avoiding bright lights and loud noises when symptoms are present -If your headaches continue to persist please follow-up with your primary doctor for reevaluation -At any point if you have the worst headache of your life please go to the nearest emergency department for immediate evaluation

## 2022-09-12 NOTE — ED Triage Notes (Signed)
Pt Came by airplane from [place] on Saturday. Was Having abd pains on flight that still on going. Pt c/o headache that started around 10am, has sensitivity to light. Took about 3 Tylenol that didn't help.

## 2022-09-17 ENCOUNTER — Ambulatory Visit (INDEPENDENT_AMBULATORY_CARE_PROVIDER_SITE_OTHER): Payer: Commercial Managed Care - HMO | Admitting: Obstetrics & Gynecology

## 2022-09-17 ENCOUNTER — Encounter: Payer: Self-pay | Admitting: Obstetrics & Gynecology

## 2022-09-17 VITALS — BP 127/88 | HR 61 | Ht 62.0 in | Wt 182.0 lb

## 2022-09-17 DIAGNOSIS — T8332XD Displacement of intrauterine contraceptive device, subsequent encounter: Secondary | ICD-10-CM

## 2022-09-17 DIAGNOSIS — N819 Female genital prolapse, unspecified: Secondary | ICD-10-CM

## 2022-09-17 DIAGNOSIS — Z1231 Encounter for screening mammogram for malignant neoplasm of breast: Secondary | ICD-10-CM | POA: Diagnosis not present

## 2022-09-17 NOTE — Progress Notes (Signed)
GYNECOLOGY OFFICE VISIT NOTE  History:   Leah Cain is a 51 y.o. G4P0010 here today for discussion of her retained IUD, also because of prolapse noted. Speaks Arabic, here with husband.  As for her retained IUD, she was seen for same concern in 2017.  As per Dr. Virginia Crews notes  "Has IUD in place since 2003. Supposed to be a 5 year IUD. She required surgery to elevate her uterus due to prolapse. Reports they have been to many physicians and no one can remove her IUD. Review of records reveals that she had some form of cervical shortening process that involved vaginal removal of the cervix and re-epithelialization involving the cardinal ligaments. She has had Gen anesthesia and they could not get her IUD out. On exam, no cervix visualized. Uterus could be felt on bimanual but no cervix is seen in the vaginal vault.  Unable to get out as there is no cervix to go through to try to remove it--additionally there is no cervix to perform TVH--would need TLH or TAH neither is a good option given that she is asymptomatic."  As for her bulge symptoms, she feels this occasionally. No urinary or bowel movement issues. Not very bothersome. She denies any abnormal vaginal discharge, bleeding, pelvic pain or other concerns.    Past Medical History:  Diagnosis Date   Arthritis    Eczema    Foreign body in maxillary sinus    Metalic object   GERD (gastroesophageal reflux disease)     Past Surgical History:  Procedure Laterality Date   cervical prolapse  2006    The following portions of the patient's history were reviewed and updated as appropriate: allergies, current medications, past family history, past medical history, past social history, past surgical history and problem list.   Health Maintenance: Normal mammogram on 08/29/2016.   Review of Systems:  Pertinent items noted in HPI and remainder of comprehensive ROS otherwise negative.  Physical Exam:  BP 127/88   Pulse 61   Ht '5\' 2"'$  (1.575  m)   Wt 182 lb (82.6 kg)   BMI 33.29 kg/m  CONSTITUTIONAL: Well-developed, well-nourished female in no acute distress.  HEENT:  Normocephalic, atraumatic. External right and left ear normal. No scleral icterus.  NECK: Normal range of motion, supple, no masses noted on observation SKIN: No rash noted. Not diaphoretic. No erythema. No pallor. MUSCULOSKELETAL: Normal range of motion. No edema noted. NEUROLOGIC: Alert and oriented to person, place, and time. Normal muscle tone coordination. No cranial nerve deficit noted. PSYCHIATRIC: Normal mood and affect. Normal behavior. Normal judgment and thought content. CARDIOVASCULAR: Normal heart rate noted RESPIRATORY: Effort and breath sounds normal, no problems with respiration noted ABDOMEN: No masses noted. No other overt distention noted.   PELVIC: Normal appearing external genitalia; normal urethral meatus; normal appearing vaginal mucosa.  No cervix seen. Grade 1 cystocele and rectocele noted.  No abnormal discharge noted.  Performed in the presence of a chaperone     Assessment and Plan:     1. Screening mammogram for breast cancer Mammogram to be scheduled - MM 3D SCREEN BREAST BILATERAL; Future  2. Intrauterine device (IUD) migration, subsequent encounter Reiterated what was said by Dr. Kennon Rounds, she is asymptomatic.  No need for hysterectomy at this point, which is only way IUD can be removed.   3. Female genital prolapse, unspecified type Minimal symptoms. Offered referral to Urogynecology, she declined at this time.  Routine preventative health maintenance measures emphasized. Please refer to After  Visit Summary for other counseling recommendations.   Return for any gynecologic concerns.    I spent 40 minutes dedicated to the care of this patient including pre-visit review of records, face to face time with the patient discussing her conditions and treatments and post visit orders.    Verita Schneiders, MD, [place] for [employer], [place]

## 2022-09-23 ENCOUNTER — Ambulatory Visit: Payer: 59 | Admitting: Rheumatology

## 2022-10-01 ENCOUNTER — Other Ambulatory Visit (HOSPITAL_COMMUNITY): Payer: Self-pay

## 2022-10-03 ENCOUNTER — Other Ambulatory Visit (HOSPITAL_COMMUNITY): Payer: Self-pay

## 2022-10-22 ENCOUNTER — Other Ambulatory Visit (HOSPITAL_COMMUNITY): Payer: Self-pay

## 2022-10-24 ENCOUNTER — Other Ambulatory Visit (HOSPITAL_COMMUNITY): Payer: Self-pay

## 2022-10-24 MED ORDER — MELOXICAM 15 MG PO TABS
15.0000 mg | ORAL_TABLET | Freq: Every day | ORAL | 0 refills | Status: AC
  Filled 2022-10-24: qty 90, 90d supply, fill #0

## 2022-10-24 MED ORDER — OXYCODONE HCL 15 MG PO TABS
15.0000 mg | ORAL_TABLET | Freq: Three times a day (TID) | ORAL | 0 refills | Status: DC | PRN
  Filled 2022-10-24: qty 90, 30d supply, fill #0

## 2022-10-24 MED ORDER — METHOCARBAMOL 500 MG PO TABS
ORAL_TABLET | ORAL | 0 refills | Status: AC
  Filled 2022-10-24: qty 60, 8d supply, fill #0

## 2022-10-24 MED ORDER — PREGABALIN 150 MG PO CAPS
150.0000 mg | ORAL_CAPSULE | Freq: Two times a day (BID) | ORAL | 2 refills | Status: DC
  Filled 2022-10-24 – 2022-11-01 (×2): qty 60, 30d supply, fill #0
  Filled 2022-11-29 (×2): qty 60, 30d supply, fill #1
  Filled 2022-12-26 (×2): qty 60, 30d supply, fill #2

## 2022-10-27 ENCOUNTER — Other Ambulatory Visit (HOSPITAL_COMMUNITY): Payer: Self-pay

## 2022-10-31 ENCOUNTER — Other Ambulatory Visit (HOSPITAL_COMMUNITY): Payer: Self-pay

## 2022-11-01 ENCOUNTER — Other Ambulatory Visit (HOSPITAL_COMMUNITY): Payer: Self-pay

## 2022-11-12 ENCOUNTER — Ambulatory Visit: Payer: Commercial Managed Care - HMO

## 2022-11-22 ENCOUNTER — Other Ambulatory Visit (HOSPITAL_COMMUNITY): Payer: Self-pay

## 2022-11-22 MED ORDER — ONDANSETRON 8 MG PO TBDP
8.0000 mg | ORAL_TABLET | Freq: Three times a day (TID) | ORAL | 0 refills | Status: AC | PRN
  Filled 2022-11-22: qty 30, 10d supply, fill #0

## 2022-11-22 MED ORDER — OXYCODONE HCL 15 MG PO TABS
15.0000 mg | ORAL_TABLET | Freq: Three times a day (TID) | ORAL | 0 refills | Status: DC | PRN
  Filled 2022-11-22: qty 90, 30d supply, fill #0

## 2022-11-29 ENCOUNTER — Other Ambulatory Visit (HOSPITAL_COMMUNITY): Payer: Self-pay

## 2022-12-05 ENCOUNTER — Other Ambulatory Visit (HOSPITAL_COMMUNITY): Payer: Self-pay

## 2022-12-05 ENCOUNTER — Ambulatory Visit (HOSPITAL_COMMUNITY)
Admission: EM | Admit: 2022-12-05 | Discharge: 2022-12-05 | Disposition: A | Discharge status: Home / Self Care | Payer: Medicaid Other | Attending: Emergency Medicine | Admitting: Emergency Medicine

## 2022-12-05 ENCOUNTER — Ambulatory Visit (INDEPENDENT_AMBULATORY_CARE_PROVIDER_SITE_OTHER): Payer: Medicaid Other

## 2022-12-05 ENCOUNTER — Encounter (HOSPITAL_COMMUNITY): Payer: Self-pay

## 2022-12-05 DIAGNOSIS — M25562 Pain in left knee: Secondary | ICD-10-CM

## 2022-12-05 DIAGNOSIS — M779 Enthesopathy, unspecified: Secondary | ICD-10-CM | POA: Diagnosis not present

## 2022-12-05 DIAGNOSIS — G8929 Other chronic pain: Secondary | ICD-10-CM

## 2022-12-05 DIAGNOSIS — M79671 Pain in right foot: Secondary | ICD-10-CM

## 2022-12-05 MED ORDER — KETOROLAC TROMETHAMINE 10 MG PO TABS
10.0000 mg | ORAL_TABLET | Freq: Four times a day (QID) | ORAL | 0 refills | Status: AC | PRN
  Filled 2022-12-05: qty 20, 5d supply, fill #0

## 2022-12-05 NOTE — Discharge Instructions (Signed)
The x-rays indicate a bone spur in your left knee.  Your right heel x-ray was negative for fracture or deformity.  Please take the 10 mg Toradol as needed for pain and do not take this with meloxicam.  Please follow-up with sports medicine as they might be able to offer you steroid joint injections to further manage your pain.  For your heel please rest, ice and elevate this.  The Toradol will help with this pain as well.  Please seek immediate care if you find warmth, erythema, swelling and extreme pain from you are unable to walk, or have fever that does not resolve with medication.

## 2022-12-05 NOTE — ED Provider Notes (Signed)
[facility]    CSN: [id number] Arrival date & time: 12/05/22  1540      History   Chief Complaint Chief Complaint  Patient presents with   Knee Pain    HPI Leah Cain is a 52 y.o. female.   History is provided by the spouse and patient.  Reports months of left posterior knee pain that has gotten worse over the past 2 days.  Denies trauma, falls, twisting, swelling or injury.  Also reports 1 day of right heel pain, tenderness with walking.  Denies any falls twisting or trauma.  Has tried muscle relaxers and oxycodone without relief.   Denies chest pain, shortness of breath, abdominal pain, recent illness.  The history is provided by the patient and the spouse.  Knee Pain Location:  Knee Time since incident:  10 months Injury: no   Knee location:  L knee Pain details:    Quality:  Aching   Radiates to:  L leg   Severity:  Severe   Onset quality:  Unable to specify   Progression:  Worsening Dislocation: no   Foreign body present:  No foreign bodies Ineffective treatments:  Arthritis medication, NSAIDs, acetaminophen and muscle relaxant Associated symptoms: no back pain, no fatigue and no fever     Past Medical History:  Diagnosis Date   Arthritis    Eczema    Foreign body in maxillary sinus    Metalic object   GERD (gastroesophageal reflux disease)     Patient Active Problem List   Diagnosis Date Noted   Cervical disc disorder with radiculopathy of cervical region 11/07/2015   Chronic low back pain 06/15/2014   Positive H. pylori test 12/26/2013   IUD retained ([place]) 09/19/2013   Postprandial epigastric pain 09/19/2013   Postprandial RUQ pain 09/19/2013    Past Surgical History:  Procedure Laterality Date   cervical prolapse  2006    OB History     Gravida  4   Para  3   Term      Preterm      AB  1   Living         SAB  1   IAB      Ectopic      Multiple      Live Births  3            Home Medications    Prior  to Admission medications   Medication Sig Start Date End Date Taking? Authorizing Provider  ketorolac (TORADOL) 10 MG tablet Take 1 tablet (10 mg) by mouth every 6 hours as needed for moderate pain. 12/05/22  Yes Louretta Shorten, [place] N, FNP  acetaminophen (TYLENOL) 500 MG tablet Take 500 mg by mouth every 6 (six) hours as needed for moderate pain.    [provider]  dicyclomine (BENTYL) 20 MG tablet Take 1 tablet (20 mg total) by mouth 2 (two) times daily. 09/12/22   White, Leitha Schuller, NP  gabapentin (NEURONTIN) 300 MG capsule Take 1 capsule (300 mg total) by mouth 3 (three) times daily. 07/01/22   Barrett Henle, MD  meloxicam (MOBIC) 15 MG tablet Take 1 tablet (15 mg total) by mouth daily for inflammatory pain. 10/24/22     methocarbamol (ROBAXIN) 500 MG tablet Take 2 tablets by mouth 4 times a day as needed. 10/24/22     ondansetron (ZOFRAN-ODT) 8 MG disintegrating tablet Take 1 tablet (8 mg total) by mouth 3 (three) times daily as needed. 11/22/22  oxyCODONE (ROXICODONE) 15 MG immediate release tablet Take 1 tablet (15 mg total) by mouth 3 (three) times daily as needed for breakthrough pain 11/22/22     pantoprazole (PROTONIX) 40 MG tablet Take 1 tablet by mouth 2 times daily before a meal. Patient not taking: Reported on 06/25/2022 02/10/22 03/27/22  Vladimir Crofts, PA-C  pregabalin (LYRICA) 150 MG capsule Take 1 capsule (150 mg total) by mouth 2 (two) times daily for chronic nerve pain, 10/24/22       Family History Family History  Problem Relation Age of Onset   Stroke Father    Colon cancer Neg Hx    Esophageal cancer Neg Hx    Stomach cancer Neg Hx     Social History Social History   Tobacco Use   Smoking status: Never   Smokeless tobacco: Never  Vaping Use   Vaping Use: Never used  Substance Use Topics   Alcohol use: No   Drug use: No     Allergies   Patient has no known allergies.   Review of Systems Review of Systems  Constitutional:  Negative for fatigue and  fever.  HENT:  Negative for sore throat.   Respiratory:  Negative for cough, chest tightness and shortness of breath.   Cardiovascular:  Negative for chest pain.  Gastrointestinal:  Negative for abdominal pain.  Musculoskeletal:  Positive for arthralgias. Negative for back pain, gait problem and joint swelling.     Physical Exam Triage Vital Signs ED Triage Vitals [12/05/22 1626]  Enc Vitals Group     BP (!) 142/87     Pulse Rate (!) 57     Resp 16     Temp 97.9 F (36.6 C)     Temp Source Oral     SpO2 96 %     Weight      Height      Head Circumference      Peak Flow      Pain Score 9     Pain Loc      Pain Edu?      Excl. in [place]?    No data found.  Updated Vital Signs BP (!) 142/87 (BP Location: Left Arm)   Pulse (!) 57   Temp 97.9 F (36.6 C) (Oral)   Resp 16   SpO2 96%   Visual Acuity Right Eye Distance:   Left Eye Distance:   Bilateral Distance:    Right Eye Near:   Left Eye Near:    Bilateral Near:     Physical Exam Vitals and nursing note reviewed.  Constitutional:      Appearance: Normal appearance.     Comments: Pleasant 52 year old female who appears stated age.  HENT:     Head: Normocephalic and atraumatic.     Right Ear: External ear normal.     Left Ear: External ear normal.  Cardiovascular:     Rate and Rhythm: Normal rate and regular rhythm.     Pulses: Normal pulses.     Heart sounds: Normal heart sounds, S1 normal and S2 normal.  Pulmonary:     Effort: Pulmonary effort is normal. No respiratory distress.     Breath sounds: Normal breath sounds. No wheezing or rhonchi.     Comments: Lungs vesicular posteriorly. Musculoskeletal:        General: Tenderness present. No swelling, deformity or signs of injury. Normal range of motion.     Right lower leg: No edema.     Left lower  leg: Tenderness present. No deformity or lacerations. No edema.     Right foot: Normal range of motion and normal capillary refill. Bony tenderness present. No  swelling, deformity, foot drop or laceration. Normal pulse.       Legs:       Feet:     Comments: Point tenderness to left posterior knee, midline.  Point tenderness to right heel.  Skin:    General: Skin is warm and dry.     Capillary Refill: Capillary refill takes less than 2 seconds.  Neurological:     General: No focal deficit present.     Mental Status: She is alert.  Psychiatric:        Behavior: Behavior is cooperative.      UC Treatments / Results  Labs (all labs ordered are listed, but only abnormal results are displayed) Labs Reviewed - No data to display  EKG   Radiology DG Knee Complete 4 Views Left  Result Date: 12/05/2022 CLINICAL DATA:  Left knee pain. EXAM: LEFT KNEE - COMPLETE 4+ VIEW COMPARISON:  None Available. FINDINGS: No fracture. No subluxation or dislocation. No joint effusion. Hypertrophic spurring is seen in the medial and patellofemoral compartments. IMPRESSION: No acute findings. Hypertrophic spurring in the medial and patellofemoral compartments. Electronically Signed   By: Misty Stanley M.D.   On: 12/05/2022 17:18   DG Foot Complete Right  Result Date: 12/05/2022 CLINICAL DATA:  foot pain, heel pain EXAM: RIGHT FOOT COMPLETE - 3+ VIEW COMPARISON:  None Available. FINDINGS: There is no evidence of fracture or dislocation. Calcaneal spur. There is no evidence of arthropathy or other focal bone abnormality. Soft tissues are unremarkable. IMPRESSION: Negative. Electronically Signed   By: Lucrezia Europe M.D.   On: 12/05/2022 17:17    Procedures Procedures (including critical care time)  Medications Ordered in UC Medications - No data to display  Initial Impression / Assessment and Plan / UC Course  I have reviewed the triage vital signs and the nursing notes.  Pertinent labs & imaging results that were available during my care of the patient were reviewed by me and considered in my medical decision making (see chart for details).  Ongoing posterior  left knee pain for over 10 months, new onset of right heel pain for the past day.  No trauma or injuries.  No swelling. Discussed due to duration of symptoms and finding of bone spur on left knee imaging, stressed the importance of follow-up with orthopedics as they might be able to do steroid injections or further imaging if indicated.  Negative heel imaging. Discussed rest, ice for pain to right heel.  Deferred narcotic treatment at this time, as it is not indicated.  Will trial 10 mg of Toradol every 6 hours as needed for pain, as this has worked in the past.  Plan of care and return precautions discussed with patient and husband, both verbalized understanding.    Final Clinical Impressions(s) / UC Diagnoses   Final diagnoses:  Chronic pain of left knee  Pain of right heel  Bone spur of other site     Discharge Instructions      The x-rays indicate a bone spur in your left knee.  Your right heel x-ray was negative for fracture or deformity.  Please take the 10 mg Toradol as needed for pain and do not take this with meloxicam.  Please follow-up with sports medicine as they might be able to offer you steroid joint injections to further manage your  pain.  For your heel please rest, ice and elevate this.  The Toradol will help with this pain as well.  Please seek immediate care if you find warmth, erythema, swelling and extreme pain from you are unable to walk, or have fever that does not resolve with medication.     ED Prescriptions     Medication Sig Dispense Auth. Provider   ketorolac (TORADOL) 10 MG tablet Take 1 tablet (10 mg) by mouth every 6 hours as needed for moderate pain. 20 tablet Chalsea Darko, [place], [place]      PDMP not reviewed this encounter.   Skyla Champagne, [place] N, [place] 12/05/22 [phone]

## 2022-12-05 NOTE — ED Triage Notes (Signed)
Patient c/o left knee pain especially behind the left knee x 6 months, but worse in the past 2 days. Patient saw another physician on 10/24/22. Patient was prescribed Meloxicam and Robaxin, but states it did not help much.  Oxycodone 1/2 tab for a history of arthritis.

## 2022-12-06 ENCOUNTER — Other Ambulatory Visit (HOSPITAL_COMMUNITY): Payer: Self-pay

## 2022-12-19 ENCOUNTER — Other Ambulatory Visit (HOSPITAL_COMMUNITY): Payer: Self-pay

## 2022-12-19 MED ORDER — OXYCODONE HCL 15 MG PO TABS
15.0000 mg | ORAL_TABLET | Freq: Three times a day (TID) | ORAL | 0 refills | Status: AC | PRN
  Filled 2022-12-19 – 2022-12-20 (×2): qty 90, 30d supply, fill #0

## 2022-12-20 ENCOUNTER — Other Ambulatory Visit (HOSPITAL_BASED_OUTPATIENT_CLINIC_OR_DEPARTMENT_OTHER): Payer: Self-pay

## 2022-12-20 ENCOUNTER — Other Ambulatory Visit (HOSPITAL_COMMUNITY): Payer: Self-pay

## 2022-12-26 ENCOUNTER — Other Ambulatory Visit (HOSPITAL_COMMUNITY): Payer: Self-pay

## 2023-01-17 ENCOUNTER — Other Ambulatory Visit (HOSPITAL_COMMUNITY): Payer: Self-pay

## 2023-01-17 MED ORDER — GABAPENTIN 400 MG PO CAPS
400.0000 mg | ORAL_CAPSULE | Freq: Three times a day (TID) | ORAL | 0 refills | Status: DC
  Filled 2023-01-17: qty 42, 14d supply, fill #0

## 2023-01-17 MED ORDER — HYDROCODONE-ACETAMINOPHEN 7.5-325 MG PO TABS
1.0000 | ORAL_TABLET | Freq: Three times a day (TID) | ORAL | 0 refills | Status: DC | PRN
  Filled 2023-01-17: qty 42, 14d supply, fill #0

## 2023-01-19 ENCOUNTER — Other Ambulatory Visit (HOSPITAL_COMMUNITY): Payer: Self-pay

## 2023-01-19 MED ORDER — ERGOCALCIFEROL 1.25 MG (50000 UT) PO CAPS
50000.0000 [IU] | ORAL_CAPSULE | ORAL | 1 refills | Status: AC
  Filled 2023-01-19 – 2023-01-29 (×2): qty 12, 84d supply, fill #0

## 2023-01-20 ENCOUNTER — Other Ambulatory Visit (HOSPITAL_COMMUNITY): Payer: Self-pay

## 2023-01-28 ENCOUNTER — Other Ambulatory Visit (HOSPITAL_COMMUNITY): Payer: Self-pay

## 2023-01-29 ENCOUNTER — Other Ambulatory Visit (HOSPITAL_COMMUNITY): Payer: Self-pay

## 2023-01-30 ENCOUNTER — Other Ambulatory Visit (HOSPITAL_COMMUNITY): Payer: Self-pay

## 2023-01-31 ENCOUNTER — Other Ambulatory Visit (HOSPITAL_COMMUNITY): Payer: Self-pay

## 2023-01-31 MED ORDER — HYDROCODONE-ACETAMINOPHEN 7.5-325 MG PO TABS
1.0000 | ORAL_TABLET | Freq: Three times a day (TID) | ORAL | 0 refills | Status: AC | PRN
  Filled 2023-01-31: qty 42, 14d supply, fill #0

## 2023-01-31 MED ORDER — GABAPENTIN 400 MG PO CAPS
400.0000 mg | ORAL_CAPSULE | Freq: Three times a day (TID) | ORAL | 0 refills | Status: AC
  Filled 2023-01-31: qty 42, 14d supply, fill #0

## 2023-01-31 MED ORDER — HYDROCODONE-ACETAMINOPHEN 7.5-325 MG PO TABS: 1.0000 | ORAL_TABLET | Freq: Three times a day (TID) | ORAL | 0 refills | Status: AC | PRN

## 2023-02-03 ENCOUNTER — Other Ambulatory Visit (HOSPITAL_COMMUNITY): Payer: Self-pay

## 2023-02-03 MED ORDER — PREGABALIN 75 MG PO CAPS
75.0000 mg | ORAL_CAPSULE | ORAL | 4 refills | Status: DC
  Filled 2023-02-03: qty 60, 34d supply, fill #0
  Filled 2023-03-03 (×2): qty 60, 30d supply, fill #1
  Filled 2023-03-03: qty 60, 34d supply, fill #1
  Filled 2023-04-01: qty 60, 30d supply, fill #2
  Filled 2023-04-29: qty 60, 30d supply, fill #3
  Filled 2023-05-27: qty 30, 19d supply, fill #4
  Filled 2023-05-27: qty 30, 11d supply, fill #4

## 2023-02-13 ENCOUNTER — Other Ambulatory Visit (HOSPITAL_COMMUNITY): Payer: Self-pay

## 2023-02-13 MED ORDER — PREGABALIN 150 MG PO CAPS
150.0000 mg | ORAL_CAPSULE | Freq: Two times a day (BID) | ORAL | 5 refills | Status: AC
  Filled 2023-02-13: qty 60, 30d supply, fill #0
  Filled 2023-03-13: qty 60, 30d supply, fill #1
  Filled 2023-04-11 (×2): qty 60, 30d supply, fill #2
  Filled 2023-05-09: qty 60, 30d supply, fill #3
  Filled 2023-06-06: qty 60, 30d supply, fill #4
  Filled 2023-07-04 (×2): qty 60, 30d supply, fill #5

## 2023-02-13 MED ORDER — OXYCODONE HCL 5 MG PO TABS
5.0000 mg | ORAL_TABLET | Freq: Three times a day (TID) | ORAL | 0 refills | Status: AC | PRN
  Filled 2023-02-13: qty 21, 7d supply, fill #0

## 2023-02-24 ENCOUNTER — Other Ambulatory Visit (HOSPITAL_COMMUNITY): Payer: Self-pay

## 2023-02-24 MED ORDER — OXYCODONE HCL 5 MG PO TABS
ORAL_TABLET | ORAL | 0 refills | Status: AC
  Filled 2023-02-24: qty 63, 21d supply, fill #0

## 2023-02-26 ENCOUNTER — Other Ambulatory Visit (HOSPITAL_COMMUNITY): Payer: Self-pay

## 2023-03-03 ENCOUNTER — Other Ambulatory Visit (HOSPITAL_COMMUNITY): Payer: Self-pay

## 2023-03-03 ENCOUNTER — Other Ambulatory Visit: Payer: Self-pay

## 2023-03-13 ENCOUNTER — Other Ambulatory Visit (HOSPITAL_COMMUNITY): Payer: Self-pay

## 2023-03-17 ENCOUNTER — Other Ambulatory Visit (HOSPITAL_COMMUNITY): Payer: Self-pay

## 2023-03-17 MED ORDER — OXYCODONE HCL 10 MG PO TABS
10.0000 mg | ORAL_TABLET | Freq: Three times a day (TID) | ORAL | 0 refills | Status: AC
  Filled 2023-03-17 – 2023-04-13 (×2): qty 90, 30d supply, fill #0

## 2023-03-17 MED ORDER — DULOXETINE HCL 30 MG PO CPEP
30.0000 mg | ORAL_CAPSULE | Freq: Every day | ORAL | 0 refills | Status: DC
  Filled 2023-03-17 – 2023-04-01 (×2): qty 30, 30d supply, fill #0

## 2023-03-20 ENCOUNTER — Other Ambulatory Visit: Payer: Self-pay | Admitting: *Deleted

## 2023-03-20 DIAGNOSIS — M5412 Radiculopathy, cervical region: Secondary | ICD-10-CM

## 2023-03-21 ENCOUNTER — Other Ambulatory Visit (HOSPITAL_COMMUNITY): Payer: Self-pay

## 2023-03-26 ENCOUNTER — Other Ambulatory Visit (HOSPITAL_COMMUNITY): Payer: Self-pay

## 2023-04-01 ENCOUNTER — Other Ambulatory Visit (HOSPITAL_COMMUNITY): Payer: Self-pay

## 2023-04-05 ENCOUNTER — Other Ambulatory Visit: Payer: BLUE CROSS/BLUE SHIELD

## 2023-04-09 ENCOUNTER — Other Ambulatory Visit (HOSPITAL_COMMUNITY): Payer: Self-pay

## 2023-04-09 MED ORDER — DICLOFENAC SODIUM 1 % EX GEL
CUTANEOUS | 0 refills | Status: DC
  Filled 2023-04-09: qty 200, 13d supply, fill #0

## 2023-04-09 MED ORDER — OXYCODONE HCL 10 MG PO TABS
10.0000 mg | ORAL_TABLET | Freq: Three times a day (TID) | ORAL | 0 refills | Status: AC | PRN
  Filled 2023-09-07: qty 90, 30d supply, fill #0
  Filled 2023-09-07: qty 70, 23d supply, fill #0
  Filled 2023-09-07: qty 20, 7d supply, fill #0
  Filled 2023-09-07: qty 30, 10d supply, fill #0

## 2023-04-09 MED ORDER — DULOXETINE HCL 30 MG PO CPEP
30.0000 mg | ORAL_CAPSULE | Freq: Every day | ORAL | 0 refills | Status: AC
  Filled 2023-04-09: qty 30, 30d supply, fill #0

## 2023-04-11 ENCOUNTER — Other Ambulatory Visit (HOSPITAL_COMMUNITY): Payer: Self-pay

## 2023-04-13 ENCOUNTER — Other Ambulatory Visit (HOSPITAL_COMMUNITY): Payer: Self-pay

## 2023-04-29 ENCOUNTER — Other Ambulatory Visit (HOSPITAL_COMMUNITY): Payer: Self-pay

## 2023-05-02 IMAGING — CT CT HEAD W/O CM
3 series · 15 of 47 positions shown, 18 images · non-contrast
Comparison: None.

CLINICAL DATA: Frontal lobe headache.

EXAM:
CT HEAD WITHOUT CONTRAST
TECHNIQUE: Contiguous axial images were obtained from the base of the skull
through the vertex without intravenous contrast.

[Series 2: head wo · axial · 0.47mm/px · z∈[-40,+84]mm · 9 of 30 slices shown, 12 images]
[im 3/30  brain]
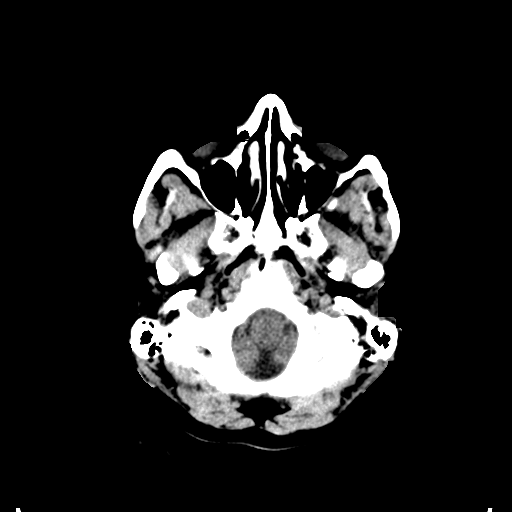
[im 3/30  bone]
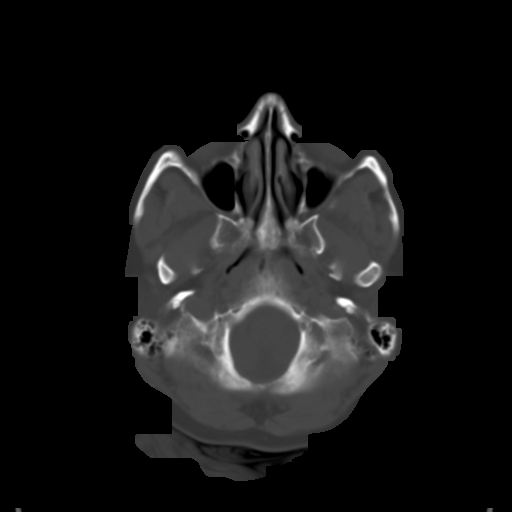
[im 6/30  brain]
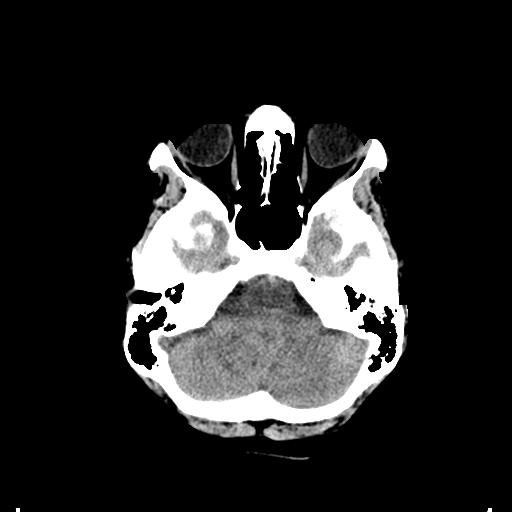
[im 9/30  brain]
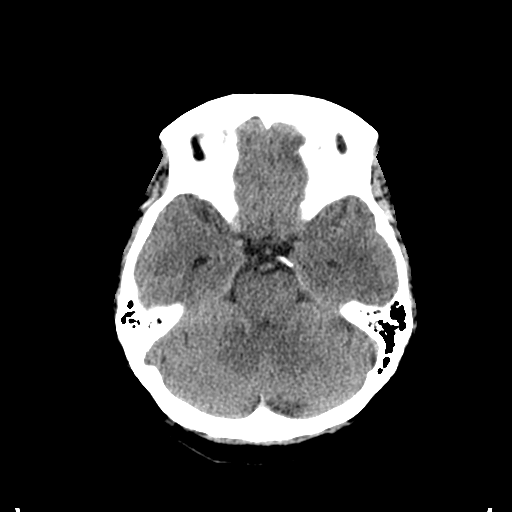
[im 12/30  brain]
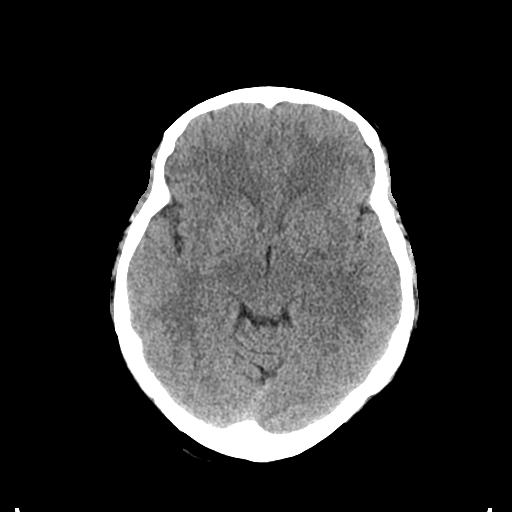
[im 16/30  brain]
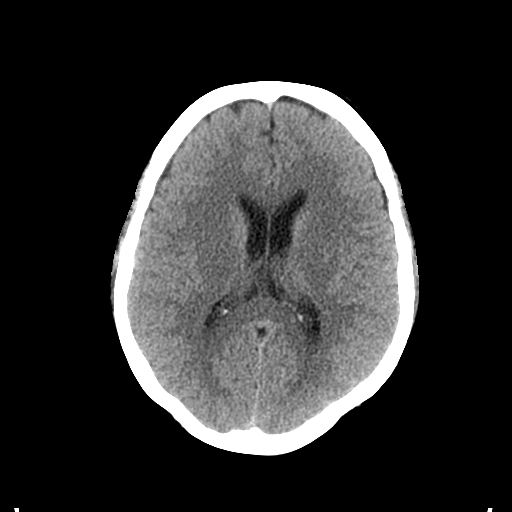
[im 16/30  bone]
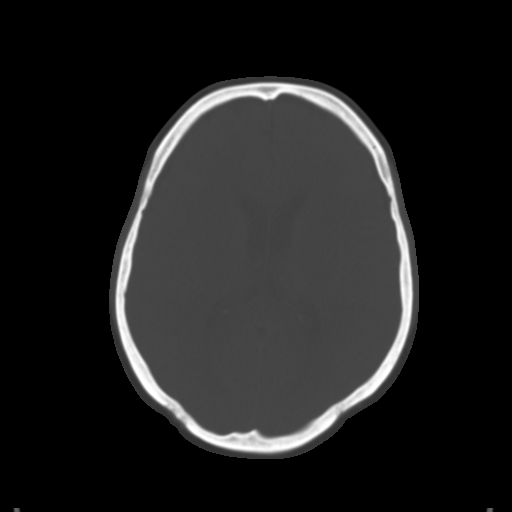
[im 19/30  brain]
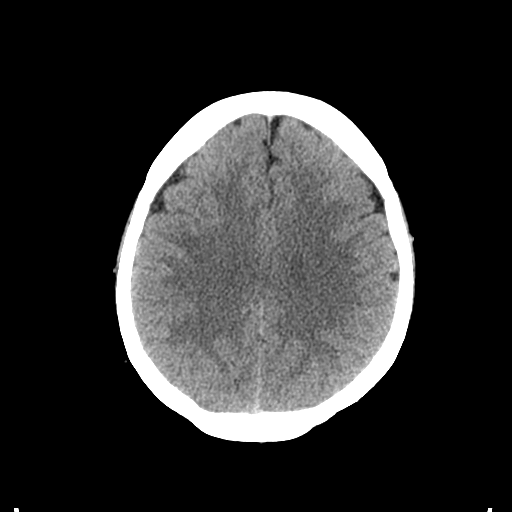
[im 22/30  brain]
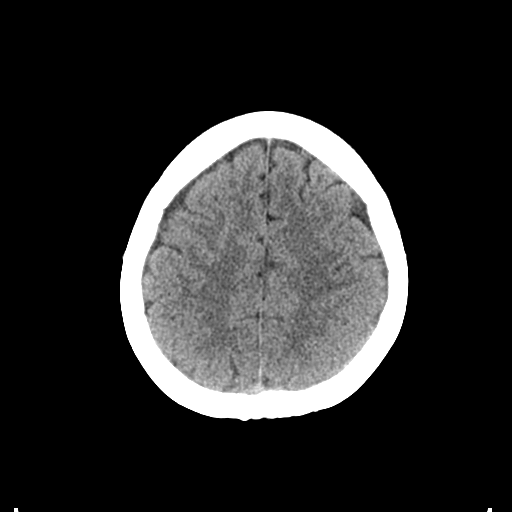
[im 25/30  brain]
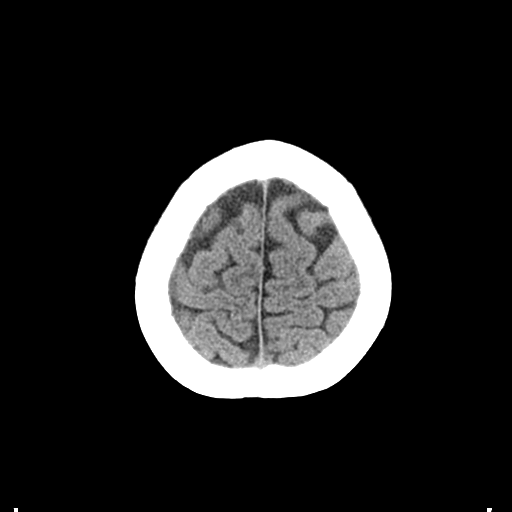
[im 28/30  brain]
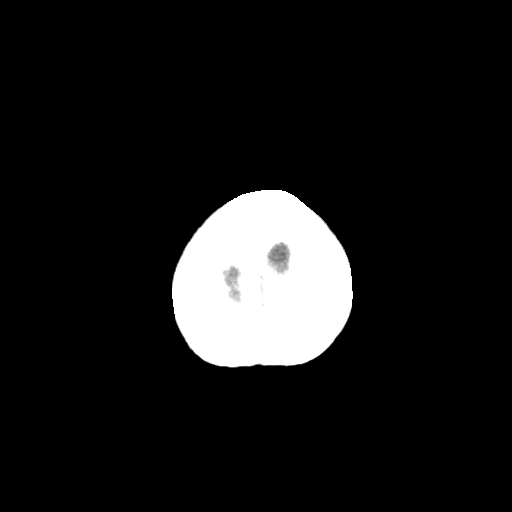
[im 28/30  bone]
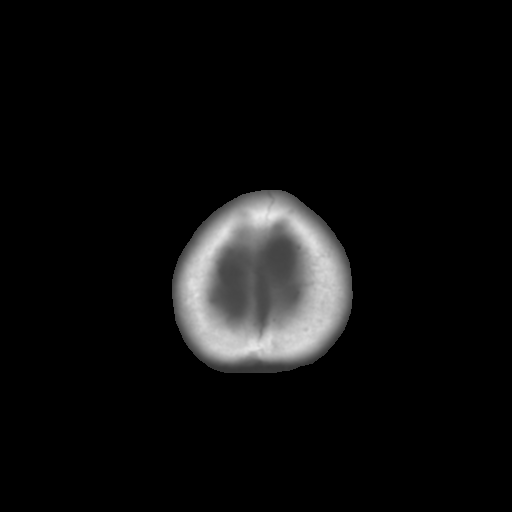

[Series 4: coronal soft tissue · coronal · 0.28mm/px · 3 of 63 slices shown]
[im 21/63  brain]
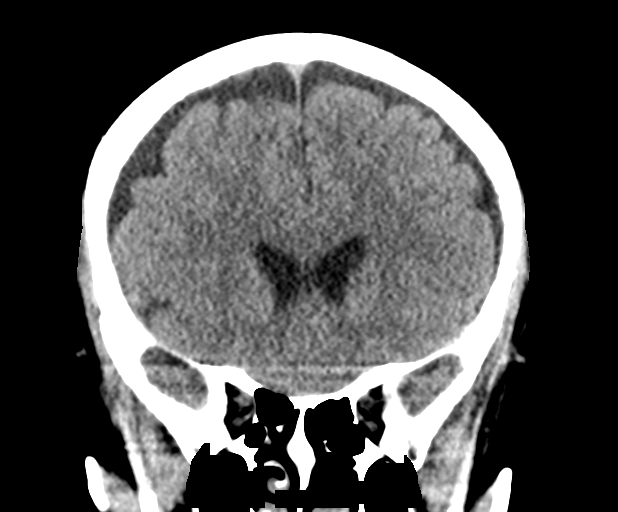
[im 28/63  brain]
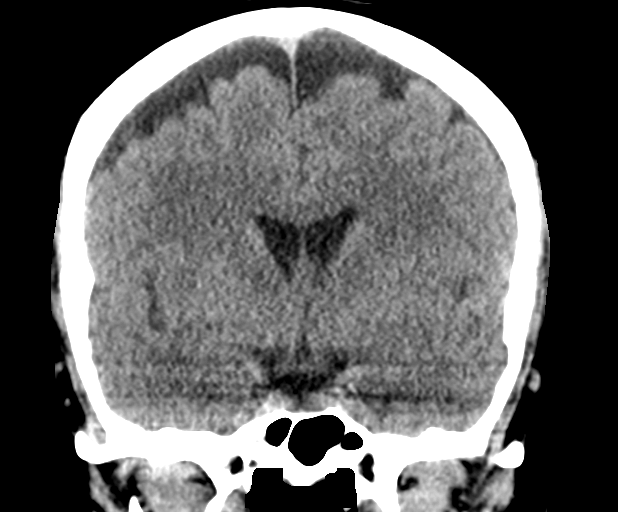
[im 35/63  brain]
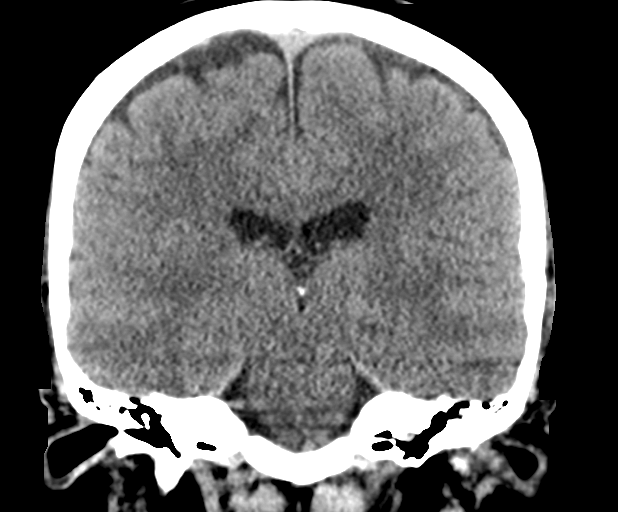

[Series 5: sagittal soft tissue · sagittal · 0.28mm/px · 3 of 75 slices shown]
[im 25/75  brain]
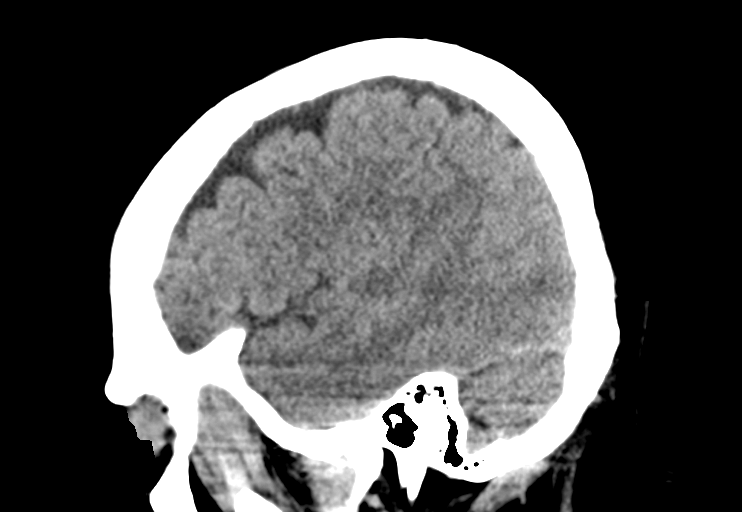
[im 38/75  brain]
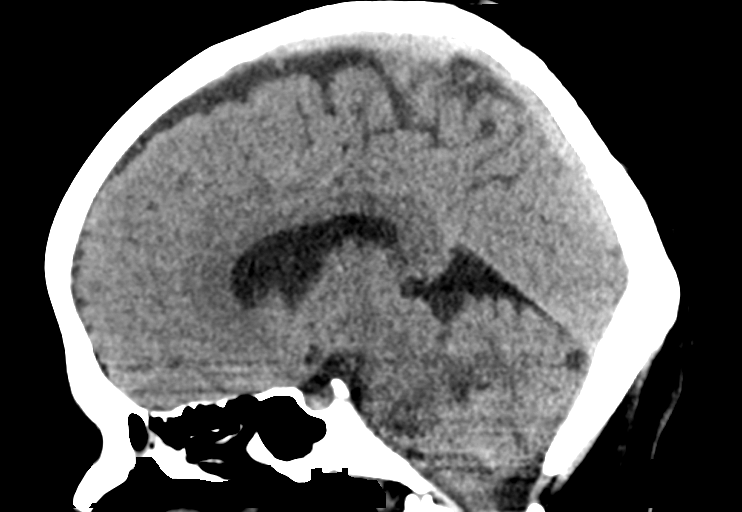
[im 50/75  brain]
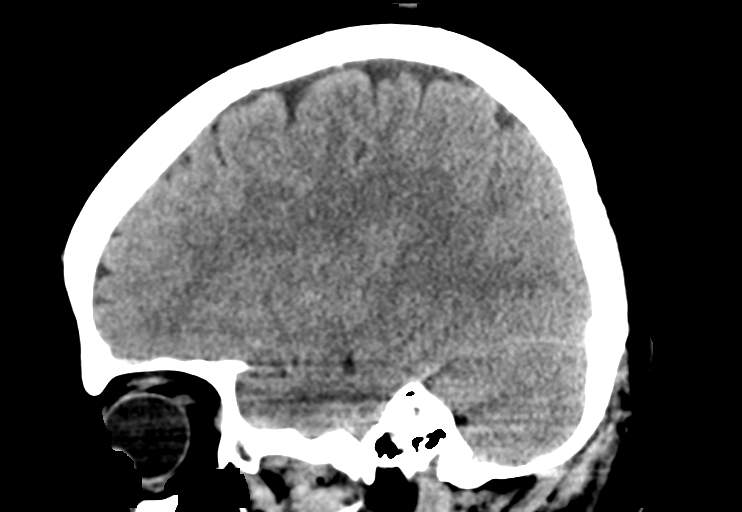

[15 of 47 positions shown; findings below may reference images not displayed]

FINDINGS: Brain: No evidence of acute infarction, hemorrhage, hydrocephalus,
extra-axial collection or mass lesion/mass effect.

Vascular: No hyperdense vessel or unexpected calcification.

Skull: Normal. Negative for fracture or focal lesion.

Sinuses/Orbits: No acute finding.

Other: None.
IMPRESSION: No acute intracranial abnormality.

## 2023-05-09 ENCOUNTER — Other Ambulatory Visit (HOSPITAL_COMMUNITY): Payer: Self-pay

## 2023-05-12 ENCOUNTER — Other Ambulatory Visit (HOSPITAL_COMMUNITY): Payer: Self-pay

## 2023-05-12 MED ORDER — OXYCODONE HCL 10 MG PO TABS
10.0000 mg | ORAL_TABLET | Freq: Three times a day (TID) | ORAL | 0 refills | Status: AC | PRN
  Filled 2023-05-12: qty 90, 30d supply, fill #0

## 2023-05-12 MED ORDER — DULOXETINE HCL 30 MG PO CPEP
30.0000 mg | ORAL_CAPSULE | Freq: Every day | ORAL | 0 refills | Status: AC
  Filled 2023-05-12: qty 30, 30d supply, fill #0

## 2023-05-12 MED ORDER — DICLOFENAC SODIUM 1 % EX GEL: CUTANEOUS | 0 refills | Status: AC

## 2023-05-27 ENCOUNTER — Other Ambulatory Visit (HOSPITAL_COMMUNITY): Payer: Self-pay

## 2023-06-03 ENCOUNTER — Other Ambulatory Visit (HOSPITAL_COMMUNITY): Payer: Self-pay

## 2023-06-03 MED ORDER — OXYCODONE HCL 15 MG PO TABS
ORAL_TABLET | ORAL | 0 refills | Status: AC
  Filled 2023-06-03: qty 15, 5d supply, fill #0

## 2023-06-06 ENCOUNTER — Other Ambulatory Visit (HOSPITAL_COMMUNITY): Payer: Self-pay

## 2023-06-08 ENCOUNTER — Other Ambulatory Visit (HOSPITAL_COMMUNITY): Payer: Self-pay

## 2023-06-08 MED ORDER — OXYCODONE HCL 15 MG PO TABS
15.0000 mg | ORAL_TABLET | Freq: Three times a day (TID) | ORAL | 0 refills | Status: DC | PRN
  Filled 2023-06-08: qty 90, 30d supply, fill #0

## 2023-06-26 ENCOUNTER — Other Ambulatory Visit (HOSPITAL_COMMUNITY): Payer: Self-pay

## 2023-07-04 ENCOUNTER — Other Ambulatory Visit (HOSPITAL_COMMUNITY): Payer: Self-pay

## 2023-07-25 ENCOUNTER — Other Ambulatory Visit (HOSPITAL_COMMUNITY): Payer: Self-pay

## 2023-07-25 ENCOUNTER — Ambulatory Visit (HOSPITAL_COMMUNITY)
Admission: EM | Admit: 2023-07-25 | Discharge: 2023-07-25 | Disposition: A | Discharge status: Home / Self Care | Payer: BLUE CROSS/BLUE SHIELD | Attending: Internal Medicine | Admitting: Internal Medicine

## 2023-07-25 ENCOUNTER — Encounter (HOSPITAL_COMMUNITY): Payer: Self-pay

## 2023-07-25 DIAGNOSIS — N898 Other specified noninflammatory disorders of vagina: Secondary | ICD-10-CM

## 2023-07-25 DIAGNOSIS — B3731 Acute candidiasis of vulva and vagina: Secondary | ICD-10-CM

## 2023-07-25 MED ORDER — FLUCONAZOLE 150 MG PO TABS
150.0000 mg | ORAL_TABLET | Freq: Every day | ORAL | 0 refills | Status: AC
  Filled 2023-07-25: qty 2, 2d supply, fill #0

## 2023-07-25 NOTE — ED Provider Notes (Signed)
[facility]    CSN: [id number] Arrival date & time: 07/25/23  1059      History   Chief Complaint Chief Complaint  Patient presents with   Vaginal Itching    X4 days    HPI Leah Cain is a 52 y.o. female.   52 year old female who presents to urgent care with complaints of vaginal itching for 4 days. No discharge, odor, pain. Denies pain with urination, frequency, urgency. No concerns for STD. Does have an IUD that has been stuck for 20 years. Has seen surgeons for this but nothing to do. Does have a little vaginal prolapse with history of repair in the past.    Vaginal Itching Pertinent negatives include no chest pain, no abdominal pain and no shortness of breath.    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 pylori test 12/26/2013   IUD retained (HCC) 09/19/2013   Postprandial epigastric pain 09/19/2013   Postprandial RUQ pain 09/19/2013    Past Surgical History:  Procedure Laterality Date   cervical prolapse  2006    OB History     Gravida  4   Para  3   Term      Preterm      AB  1   Living         SAB  1   IAB      Ectopic      Multiple      Live Births  3            Home Medications    Prior to Admission medications   Medication Sig Start Date End Date Taking? Authorizing Provider  acetaminophen (TYLENOL) 500 MG tablet Take 500 mg by mouth every 6 (six) hours as needed for moderate pain.   Yes [provider]  diclofenac Sodium (VOLTAREN) 1 % GEL Apply 4 gram to the affected area(s) (painful knee) up to 4 times daily if needed 05/12/23  Yes   dicyclomine (BENTYL) 20 MG tablet Take 1 tablet (20 mg total) by mouth 2 (two)  times daily. 09/12/22  Yes Lennell Shanks, Adrienne R, NP  DULoxetine (CYMBALTA) 30 MG capsule Take 1 capsule (30 mg) by mouth daily. 04/09/23  Yes   DULoxetine (CYMBALTA) 30 MG capsule Take 1 capsule (30 mg total) by mouth daily. 05/12/23  Yes   ergocalciferol (VITAMIN D2) 1.25 MG (50000 UT) capsule Take 1 capsule (50,000 Units total) by mouth once a week 01/19/23  Yes   gabapentin (NEURONTIN) 300 MG capsule Take 1 capsule (300 mg total) by mouth 3 (three) times daily. 07/01/22  Yes Zenia Resides, MD  gabapentin (NEURONTIN) 400 MG capsule Take 1 capsule (400 mg total) by mouth 3 (three) times daily. 01/30/23  Yes   HYDROcodone-acetaminophen (NORCO) 7.5-325 MG tablet Take 1 tablet by mouth 3 (three) times daily as needed. 01/30/23  Yes   HYDROcodone-acetaminophen (NORCO) 7.5-325 MG tablet Take 1 tablet by mouth 3 (three) times daily as needed. 01/30/23  Yes   ketorolac (TORADOL) 10 MG tablet Take 1 tablet (10 mg) by mouth every 6 hours as needed for moderate pain. 12/05/22  Yes Rinaldo Ratel, [place] N,  FNP  meloxicam (MOBIC) 15 MG tablet Take 1 tablet (15 mg total) by mouth daily for inflammatory pain. 10/24/22  Yes   methocarbamol (ROBAXIN) 500 MG tablet Take 2 tablets by mouth 4 times a day as needed. 10/24/22  Yes   ondansetron (ZOFRAN-ODT) 8 MG disintegrating tablet Take 1 tablet (8 mg total) by mouth 3 (three) times daily as needed. 11/22/22  Yes   oxyCODONE (OXY IR/ROXICODONE) 5 MG immediate release tablet Take 1 tablet (5 mg total) by mouth 3 (three) times daily as needed for severe pain. 02/13/23  Yes   oxyCODONE (OXY IR/ROXICODONE) 5 MG immediate release tablet Take 1 tablet by mouth 3 times daily, as needed for severe pain 02/23/23  Yes   oxyCODONE (ROXICODONE) 15 MG immediate release tablet Take 1 tablet (15 mg total) by mouth 3 (three) times daily as needed for breakthrough pain. 12/19/22  Yes   oxyCODONE (ROXICODONE) 15 MG immediate release tablet Take 1 tablet by mouth 3 times daily as needed 06/03/23  Yes    Oxycodone HCl 10 MG TABS Take 1 tablet (10 mg total) by mouth 3 (three) times daily as needed for severe pain. 03/16/23  Yes   Oxycodone HCl 10 MG TABS Take 1 tablet (10 mg total) by mouth 3 (three) times daily as needed for severe pain. 04/09/23  Yes   Oxycodone HCl 10 MG TABS Take 1 tablet (10 mg total) by mouth 3 (three) times daily as needed for severe pain. 05/12/23  Yes   pregabalin (LYRICA) 150 MG capsule Take 1 capsule (150 mg total) by mouth 2 (two) times daily. 02/13/23  Yes   pregabalin (LYRICA) 75 MG capsule Take 1 capsule (75 mg total) by mouth at bedtime for 1 week then one capsule twice a day. 02/03/23  Yes   pantoprazole (PROTONIX) 40 MG tablet Take 1 tablet by mouth 2 times daily before a meal. Patient not taking: Reported on 06/25/2022 02/10/22 03/27/22  Doree Albee, PA-C    Family History Family History  Problem Relation Age of Onset   Stroke Father    Colon cancer Neg Hx    Esophageal cancer Neg Hx    Stomach cancer Neg Hx     Social History Social History   Tobacco Use   Smoking status: Never   Smokeless tobacco: Never  Vaping Use   Vaping status: Never Used  Substance Use Topics   Alcohol use: No   Drug use: No     Allergies   Patient has no known allergies.   Review of Systems Review of Systems  Constitutional:  Negative for chills and fever.  HENT:  Negative for ear pain and sore throat.   Eyes:  Negative for pain and visual disturbance.  Respiratory:  Negative for cough and shortness of breath.   Cardiovascular:  Negative for chest pain and palpitations.  Gastrointestinal:  Negative for abdominal pain and vomiting.  Genitourinary:  Negative for dysuria, hematuria and vaginal discharge.       Vaginal itching  Musculoskeletal:  Negative for arthralgias and back pain.  Skin:  Negative for color change and rash.  Neurological:  Negative for seizures and syncope.  All other systems reviewed and are negative.    Physical Exam Triage Vital  Signs ED Triage Vitals  Encounter Vitals Group     BP 07/25/23 1134 122/86     Systolic BP Percentile --      Diastolic BP Percentile --      Pulse Rate 07/25/23  1134 70     Resp 07/25/23 1134 17     Temp 07/25/23 1134 98.2 F (36.8 C)     Temp Source 07/25/23 1134 Oral     SpO2 07/25/23 1134 98 %     Weight 07/25/23 1129 175 lb (79.4 kg)     Height 07/25/23 1129 5\' 2"  (1.575 m)     Head Circumference --      Peak Flow --      Pain Score 07/25/23 1129 0     Pain Loc --      Pain Education --      Exclude from Growth Chart --    No data found.  Updated Vital Signs BP 122/86 (BP Location: Left Arm)   Pulse 70   Temp 98.2 F (36.8 C) (Oral)   Resp 17   Ht 5\' 2"  (1.575 m)   Wt 175 lb (79.4 kg)   SpO2 98%   BMI 32.01 kg/m   Visual Acuity Right Eye Distance:   Left Eye Distance:   Bilateral Distance:    Right Eye Near:   Left Eye Near:    Bilateral Near:     Physical Exam Vitals and nursing note reviewed.  Constitutional:      General: She is not in acute distress.    Appearance: She is well-developed.  HENT:     Head: Normocephalic and atraumatic.  Eyes:     Conjunctiva/sclera: Conjunctivae normal.  Cardiovascular:     Rate and Rhythm: Normal rate and regular rhythm.     Heart sounds: No murmur heard. Pulmonary:     Effort: Pulmonary effort is normal. No respiratory distress.     Breath sounds: Normal breath sounds.  Abdominal:     Palpations: Abdomen is soft.     Tenderness: There is no abdominal tenderness.  Genitourinary:   Musculoskeletal:        General: No swelling.     Cervical back: Neck supple.  Skin:    General: Skin is warm and dry.     Capillary Refill: Capillary refill takes less than 2 seconds.  Neurological:     Mental Status: She is alert.  Psychiatric:        Mood and Affect: Mood normal.      UC Treatments / Results  Labs (all labs ordered are listed, but only abnormal results are displayed) Labs Reviewed - No data to  display  EKG   Radiology No results found.  Procedures Procedures (including critical care time)  Medications Ordered in UC Medications - No data to display  Initial Impression / Assessment and Plan / UC Course  I have reviewed the triage vital signs and the nursing notes.  Pertinent labs & imaging results that were available during my care of the patient were reviewed by me and considered in my medical decision making (see chart for details).     Vaginal itching  Vaginal yeast infection  Symptoms consistent with a vaginal yeast infection.  Will start Diflucan 150 mg today and repeat in 3 days if itching continues.  The patient does have some mild vaginal prolapse on physical exam and with a history of having a prolapse repair in the past I have recommended that she follow-up with a gynecologist in the area to check this. Return to urgent care or PCP if symptoms worsen or fail to resolve.   Final Clinical Impressions(s) / UC Diagnoses   Final diagnoses:  None   Discharge Instructions   None  ED Prescriptions   None    PDMP not reviewed this encounter.   Landis Martins, [place] 07/25/23 1204

## 2023-07-25 NOTE — ED Triage Notes (Signed)
Pt states that she has vaginal itching. X4 days  Pt denies any odor or discharge.

## 2023-07-25 NOTE — Discharge Instructions (Addendum)
Vaginal yeast infection likely due to recurrent mild vaginal prolapse. Start diflucan 1 tablet today and repeat in 3 days if itching continues.  Recommend following up with gynecology for vaginal prolapse evaluation.   Return to urgent care or PCP if symptoms worsen or fail to resolve.

## 2023-08-07 ENCOUNTER — Other Ambulatory Visit (HOSPITAL_COMMUNITY): Payer: Self-pay

## 2023-08-07 MED ORDER — OXYCODONE HCL 10 MG PO TABS
10.0000 mg | ORAL_TABLET | Freq: Four times a day (QID) | ORAL | 0 refills | Status: AC | PRN
  Filled 2023-08-07 – 2023-08-10 (×2): qty 120, 30d supply, fill #0

## 2023-08-10 ENCOUNTER — Other Ambulatory Visit (HOSPITAL_COMMUNITY): Payer: Self-pay

## 2023-08-10 ENCOUNTER — Other Ambulatory Visit: Payer: Self-pay

## 2023-08-24 ENCOUNTER — Other Ambulatory Visit: Payer: Self-pay

## 2023-08-24 ENCOUNTER — Other Ambulatory Visit (HOSPITAL_COMMUNITY): Payer: Self-pay

## 2023-08-24 MED ORDER — OXYCODONE HCL 5 MG PO TABS: 5.0000 mg | ORAL_TABLET | Freq: Four times a day (QID) | ORAL | 0 refills | Status: DC | PRN

## 2023-08-24 MED ORDER — OXYCODONE HCL 5 MG PO TABS: 5.0000 mg | ORAL_TABLET | Freq: Four times a day (QID) | ORAL | 0 refills | Status: AC | PRN

## 2023-08-24 MED ORDER — PREGABALIN 75 MG PO CAPS
ORAL_CAPSULE | ORAL | 2 refills | Status: DC
  Filled 2023-08-24: qty 90, 69d supply, fill #0

## 2023-09-07 ENCOUNTER — Other Ambulatory Visit (HOSPITAL_COMMUNITY): Payer: Self-pay

## 2023-09-07 ENCOUNTER — Other Ambulatory Visit: Payer: Self-pay

## 2023-09-29 ENCOUNTER — Other Ambulatory Visit (HOSPITAL_COMMUNITY): Payer: Self-pay

## 2023-09-29 MED ORDER — OXYCODONE HCL 5 MG PO TABS
5.0000 mg | ORAL_TABLET | Freq: Four times a day (QID) | ORAL | 0 refills | Status: AC | PRN
  Filled 2023-10-06: qty 120, 30d supply, fill #0

## 2023-10-06 ENCOUNTER — Other Ambulatory Visit (HOSPITAL_COMMUNITY): Payer: Self-pay

## 2023-10-26 ENCOUNTER — Other Ambulatory Visit (HOSPITAL_COMMUNITY): Payer: Self-pay

## 2023-10-26 MED ORDER — PREGABALIN 75 MG PO CAPS
ORAL_CAPSULE | ORAL | 1 refills | Status: AC
  Filled 2023-10-26: qty 90, 30d supply, fill #0
  Filled 2023-11-23: qty 90, 30d supply, fill #1

## 2023-10-26 MED ORDER — OXYCODONE HCL 5 MG PO TABS
5.0000 mg | ORAL_TABLET | Freq: Three times a day (TID) | ORAL | 0 refills | Status: AC
  Filled 2023-10-26 – 2023-11-03 (×2): qty 90, 30d supply, fill #0

## 2023-10-27 ENCOUNTER — Other Ambulatory Visit (HOSPITAL_COMMUNITY): Payer: Self-pay

## 2023-11-03 ENCOUNTER — Other Ambulatory Visit (HOSPITAL_COMMUNITY): Payer: Self-pay

## 2023-11-23 ENCOUNTER — Other Ambulatory Visit (HOSPITAL_COMMUNITY): Payer: Self-pay

## 2023-12-03 ENCOUNTER — Other Ambulatory Visit (HOSPITAL_COMMUNITY): Payer: Self-pay

## 2023-12-03 MED ORDER — OXYCODONE HCL 5 MG PO TABS
5.0000 mg | ORAL_TABLET | Freq: Three times a day (TID) | ORAL | 0 refills | Status: AC
  Filled 2023-12-03: qty 90, 30d supply, fill #0

## 2023-12-21 ENCOUNTER — Other Ambulatory Visit (HOSPITAL_COMMUNITY): Payer: Self-pay

## 2023-12-21 MED ORDER — OXYCODONE HCL 5 MG PO TABS: 5.0000 mg | ORAL_TABLET | Freq: Two times a day (BID) | ORAL | 0 refills | Status: DC

## 2023-12-21 MED ORDER — PREGABALIN 150 MG PO CAPS
150.0000 mg | ORAL_CAPSULE | Freq: Two times a day (BID) | ORAL | 2 refills | Status: DC
  Filled 2023-12-21: qty 60, 30d supply, fill #0
  Filled 2024-01-18: qty 60, 30d supply, fill #1
  Filled 2024-02-15: qty 60, 30d supply, fill #2

## 2023-12-21 MED ORDER — OXYCODONE HCL 5 MG PO TABS
5.0000 mg | ORAL_TABLET | Freq: Two times a day (BID) | ORAL | 0 refills | Status: AC | PRN
  Filled 2024-01-02: qty 60, 30d supply, fill #0

## 2023-12-29 ENCOUNTER — Other Ambulatory Visit (HOSPITAL_COMMUNITY): Payer: Self-pay

## 2023-12-29 MED ORDER — ACETAMINOPHEN-CODEINE 300-30 MG PO TABS
1.0000 | ORAL_TABLET | ORAL | 0 refills | Status: AC | PRN
  Filled 2023-12-29: qty 5, 1d supply, fill #0

## 2023-12-29 MED ORDER — CLINDAMYCIN HCL 150 MG PO CAPS
150.0000 mg | ORAL_CAPSULE | Freq: Three times a day (TID) | ORAL | 0 refills | Status: AC
Start: 2023-12-29 — End: ?
  Filled 2023-12-29: qty 21, 7d supply, fill #0

## 2024-01-02 ENCOUNTER — Other Ambulatory Visit (HOSPITAL_COMMUNITY): Payer: Self-pay

## 2024-01-18 ENCOUNTER — Other Ambulatory Visit (HOSPITAL_COMMUNITY): Payer: Self-pay

## 2024-01-26 ENCOUNTER — Other Ambulatory Visit (HOSPITAL_COMMUNITY): Payer: Self-pay

## 2024-01-26 MED ORDER — OXYCODONE HCL 5 MG PO TABS
5.0000 mg | ORAL_TABLET | Freq: Two times a day (BID) | ORAL | 0 refills | Status: AC | PRN
  Filled 2024-01-26 – 2024-02-01 (×3): qty 60, 30d supply, fill #0

## 2024-01-30 ENCOUNTER — Other Ambulatory Visit (HOSPITAL_COMMUNITY): Payer: Self-pay

## 2024-02-01 ENCOUNTER — Other Ambulatory Visit (HOSPITAL_COMMUNITY): Payer: Self-pay

## 2024-02-15 ENCOUNTER — Other Ambulatory Visit (HOSPITAL_COMMUNITY): Payer: Self-pay

## 2024-02-26 ENCOUNTER — Other Ambulatory Visit (HOSPITAL_COMMUNITY): Payer: Self-pay

## 2024-02-26 MED ORDER — OXYCODONE HCL 10 MG PO TABS
10.0000 mg | ORAL_TABLET | Freq: Two times a day (BID) | ORAL | 0 refills | Status: DC
  Filled 2024-02-26 (×2): qty 60, 30d supply, fill #0

## 2024-02-26 MED ORDER — PREGABALIN 150 MG PO CAPS
150.0000 mg | ORAL_CAPSULE | Freq: Two times a day (BID) | ORAL | 2 refills | Status: DC
  Filled 2024-02-26 – 2024-03-15 (×2): qty 60, 30d supply, fill #0
  Filled 2024-04-12: qty 60, 30d supply, fill #1
  Filled 2024-05-10: qty 60, 30d supply, fill #2

## 2024-03-12 ENCOUNTER — Other Ambulatory Visit (HOSPITAL_COMMUNITY): Payer: Self-pay

## 2024-03-15 ENCOUNTER — Other Ambulatory Visit (HOSPITAL_COMMUNITY): Payer: Self-pay

## 2024-03-28 ENCOUNTER — Other Ambulatory Visit (HOSPITAL_COMMUNITY): Payer: Self-pay

## 2024-03-28 MED ORDER — OXYCODONE HCL 5 MG PO TABS
5.0000 mg | ORAL_TABLET | ORAL | 0 refills | Status: AC
  Filled 2024-03-28: qty 60, 30d supply, fill #0

## 2024-04-12 ENCOUNTER — Other Ambulatory Visit (HOSPITAL_COMMUNITY): Payer: Self-pay

## 2024-04-27 ENCOUNTER — Other Ambulatory Visit (HOSPITAL_COMMUNITY): Payer: Self-pay

## 2024-04-27 MED ORDER — OXYCODONE HCL 10 MG PO TABS
10.0000 mg | ORAL_TABLET | Freq: Two times a day (BID) | ORAL | 0 refills | Status: DC
  Filled 2024-04-27: qty 60, 30d supply, fill #0

## 2024-05-10 ENCOUNTER — Other Ambulatory Visit (HOSPITAL_COMMUNITY): Payer: Self-pay

## 2024-05-24 ENCOUNTER — Other Ambulatory Visit (HOSPITAL_COMMUNITY): Payer: Self-pay

## 2024-05-24 MED ORDER — OXYCODONE HCL 10 MG PO TABS
10.0000 mg | ORAL_TABLET | Freq: Two times a day (BID) | ORAL | 0 refills | Status: DC
Start: 2024-05-24 — End: 2024-06-23
  Filled 2024-05-24 – 2024-05-25 (×3): qty 60, 30d supply, fill #0

## 2024-05-25 ENCOUNTER — Other Ambulatory Visit (HOSPITAL_COMMUNITY): Payer: Self-pay

## 2024-05-27 ENCOUNTER — Other Ambulatory Visit (HOSPITAL_COMMUNITY): Payer: Self-pay

## 2024-06-06 ENCOUNTER — Other Ambulatory Visit (HOSPITAL_COMMUNITY): Payer: Self-pay

## 2024-06-06 MED ORDER — PREGABALIN 150 MG PO CAPS
150.0000 mg | ORAL_CAPSULE | Freq: Two times a day (BID) | ORAL | 2 refills | Status: DC
  Filled 2024-06-06: qty 60, 30d supply, fill #0

## 2024-06-23 ENCOUNTER — Other Ambulatory Visit (HOSPITAL_COMMUNITY): Payer: Self-pay

## 2024-06-23 MED ORDER — OXYCODONE HCL 10 MG PO TABS
10.0000 mg | ORAL_TABLET | Freq: Two times a day (BID) | ORAL | 0 refills | Status: AC
Start: 2024-06-23 — End: ?
  Filled 2024-06-23: qty 60, 30d supply, fill #0

## 2024-06-29 ENCOUNTER — Other Ambulatory Visit: Payer: Self-pay

## 2024-06-29 ENCOUNTER — Other Ambulatory Visit (HOSPITAL_COMMUNITY): Payer: Self-pay

## 2024-06-30 ENCOUNTER — Other Ambulatory Visit: Payer: Self-pay

## 2024-06-30 ENCOUNTER — Other Ambulatory Visit (HOSPITAL_COMMUNITY): Payer: Self-pay

## 2024-06-30 MED ORDER — PREGABALIN 75 MG PO CAPS
ORAL_CAPSULE | ORAL | 1 refills | Status: DC
  Filled 2024-06-30 (×2): qty 60, 30d supply, fill #0
  Filled 2024-07-28: qty 60, 30d supply, fill #1
  Filled ????-??-??: fill #0

## 2024-07-18 ENCOUNTER — Other Ambulatory Visit (HOSPITAL_COMMUNITY): Payer: Self-pay

## 2024-07-18 MED ORDER — OXYCODONE HCL 10 MG PO TABS
10.0000 mg | ORAL_TABLET | Freq: Two times a day (BID) | ORAL | 0 refills | Status: AC | PRN
  Filled 2024-07-23: qty 60, 30d supply, fill #0

## 2024-07-23 ENCOUNTER — Other Ambulatory Visit (HOSPITAL_COMMUNITY): Payer: Self-pay

## 2024-07-23 ENCOUNTER — Encounter (HOSPITAL_COMMUNITY): Payer: Self-pay

## 2024-07-23 ENCOUNTER — Ambulatory Visit (INDEPENDENT_AMBULATORY_CARE_PROVIDER_SITE_OTHER)

## 2024-07-23 ENCOUNTER — Ambulatory Visit (HOSPITAL_COMMUNITY)
Admission: EM | Admit: 2024-07-23 | Discharge: 2024-07-23 | Disposition: A | Discharge status: Home / Self Care | Attending: Internal Medicine | Admitting: Internal Medicine

## 2024-07-23 DIAGNOSIS — S92101A Unspecified fracture of right talus, initial encounter for closed fracture: Secondary | ICD-10-CM

## 2024-07-23 DIAGNOSIS — M25471 Effusion, right ankle: Secondary | ICD-10-CM

## 2024-07-23 DIAGNOSIS — M25571 Pain in right ankle and joints of right foot: Secondary | ICD-10-CM

## 2024-07-23 NOTE — Discharge Instructions (Addendum)
 X-ray of the right ankle and foot done today.  Final evaluation by the radiologist shows an indeterminate age fracture along the dorsal aspect of the talus.  This is in the area where you are tender.  Due to these findings we recommend wearing a postoperative shoe while walking or standing and follow-up with orthopedics next week to have them evaluate this.  Recommend Ice the area 2-3 times daily for 10-15 minutes to help with pain and swelling. Do not apply ice directly to the skin.  Also can use ibuprofen  600 mg every 6-8 hours as needed for pain.  Can follow-up with urgent care if needed.

## 2024-07-23 NOTE — ED Triage Notes (Signed)
 Patient c/o right foot pain beside the right medial ankle area x 5 days. Patient states the pain is not as bad as it was a few days ago.Patient states she was doing normal house chores, sat down and noticed a bone projecting and had soreness present.  Patient has not taken any medication for her discomfort.  Per [employer] [phone]

## 2024-07-23 NOTE — ED Provider Notes (Signed)
 [facility]    CSN: [id number] Arrival date & time: 07/23/24  1341      History   Chief Complaint Chief Complaint  Patient presents with   Foot Pain    HPI Leah Cain is a 53 y.o. female.   53 year old female presents urgent care with complaints of right medial ankle/foot pain this started about 5 days ago.  She reports that she was not doing anything in particular except her normal housework when she developed pain in the area.  She also noted what looked like a bone protruding from the area.  That area has been very tender to the touch as well as with walking.  She has noticed some swelling as well.  The pain is a little bit better now but has not resolved.  She is not taking any medication or done any interventions for the area.   Foot Pain Pertinent negatives include no chest pain, no abdominal pain and no shortness of breath.    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 pylori test 12/26/2013   IUD retained (HCC) 09/19/2013   Postprandial epigastric pain 09/19/2013   Postprandial RUQ pain 09/19/2013    Past Surgical History:  Procedure Laterality Date   cervical prolapse  2006    OB History     Gravida  4   Para  3   Term      Preterm      AB  1   Living         SAB  1   IAB      Ectopic      Multiple      Live Births  3            Home Medications    Prior to Admission medications   Medication Sig Start Date End Date Taking? Authorizing Provider  acetaminophen  (TYLENOL ) 500 MG tablet Take 500 mg by mouth every 6 (six) hours as needed for moderate pain.    [provider]  acetaminophen -codeine  (TYLENOL  #3) 300-30 MG  tablet Take 1 tablet by mouth every 4 (four) hours as needed for pain Patient not taking: Reported on 07/23/2024 12/29/23     clindamycin  (CLEOCIN ) 150 MG capsule Take 1 capsule (150 mg total) by mouth 3 (three) times daily until finished Patient not taking: Reported on 07/23/2024 12/29/23     diclofenac  Sodium (VOLTAREN ) 1 % GEL Apply 4 gram to the affected area(s) (painful knee) up to 4 times daily if needed Patient not taking: Reported on 07/23/2024 05/12/23     dicyclomine  (BENTYL ) 20 MG tablet Take 1 tablet (20 mg total) by mouth 2 (two) times daily. Patient not taking: Reported on 07/23/2024 09/12/22   Teresa Shelba SAUNDERS, NP  DULoxetine  (CYMBALTA ) 30 MG capsule Take 1 capsule (30 mg) by mouth daily. Patient not taking: Reported on 07/23/2024 04/09/23     DULoxetine  (CYMBALTA ) 30 MG capsule Take 1 capsule (30 mg total) by mouth daily. Patient not taking: Reported on 07/23/2024 05/12/23     ergocalciferol  (VITAMIN D2) 1.25 MG (50000 UT) capsule Take 1 capsule (50,000 Units total) by mouth once a week Patient  not taking: Reported on 07/23/2024 01/19/23     fluconazole  (DIFLUCAN ) 150 MG tablet Take 1 tablet (150 mg total) by mouth daily. Take one tablet today, then repeat in 3 days if itching continues Patient not taking: Reported on 07/23/2024 07/25/23   Teresa Almarie LABOR, PA-C  gabapentin  (NEURONTIN ) 300 MG capsule Take 1 capsule (300 mg total) by mouth 3 (three) times daily. 07/01/22   Vonna Sharlet POUR, MD  gabapentin  (NEURONTIN ) 400 MG capsule Take 1 capsule (400 mg total) by mouth 3 (three) times daily. 01/30/23     HYDROcodone -acetaminophen  (NORCO) 7.5-325 MG tablet Take 1 tablet by mouth 3 (three) times daily as needed. 01/30/23     HYDROcodone -acetaminophen  (NORCO) 7.5-325 MG tablet Take 1 tablet by mouth 3 (three) times daily as needed. 01/30/23     ketorolac  (TORADOL ) 10 MG tablet Take 1 tablet (10 mg) by mouth every 6 hours as needed for moderate pain. Patient not taking: Reported on 07/23/2024  12/05/22   Dreama, [place]  N, FNP  meloxicam  (MOBIC ) 15 MG tablet Take 1 tablet (15 mg total) by mouth daily for inflammatory pain. 10/24/22     methocarbamol  (ROBAXIN ) 500 MG tablet Take 2 tablets by mouth 4 times a day as needed. 10/24/22     ondansetron  (ZOFRAN -ODT) 8 MG disintegrating tablet Take 1 tablet (8 mg total) by mouth 3 (three) times daily as needed. 11/22/22     oxyCODONE  (OXY IR/ROXICODONE ) 5 MG immediate release tablet Take 1 tablet (5 mg total) by mouth 3 (three) times daily as needed for severe pain. 02/13/23     oxyCODONE  (OXY IR/ROXICODONE ) 5 MG immediate release tablet Take 1 tablet by mouth 3 times daily, as needed for severe pain 02/23/23     oxyCODONE  (OXY IR/ROXICODONE ) 5 MG immediate release tablet Take 1-1.5 tablets (5-7.5 mg total) by mouth every 6 (six) hours as needed. (fill 09/06/23) 08/24/23     oxyCODONE  (OXY IR/ROXICODONE ) 5 MG immediate release tablet Take 1 tablet (5 mg total) by mouth every 6 (six) hours as needed. (10/06/23) 10/06/23     oxyCODONE  (OXY IR/ROXICODONE ) 5 MG immediate release tablet Take 1 tablet (5 mg total) by mouth every 8 (eight) hours. 10/26/23     oxyCODONE  (OXY IR/ROXICODONE ) 5 MG immediate release tablet Take 1 tablet (5 mg total) by mouth every 8 (eight) hours as needed 12/03/23     oxyCODONE  (OXY IR/ROXICODONE ) 5 MG immediate release tablet Take 1 tablet (5 mg total) by mouth 2 (two) times daily as needed. Wean each refill if possible. 01/02/24     oxyCODONE  (OXY IR/ROXICODONE ) 5 MG immediate release tablet Take 1 tablet (5 mg total) by mouth 2 (two) times daily as needed. 01/26/24     oxyCODONE  (OXY IR/ROXICODONE ) 5 MG immediate release tablet Take 1 tablet (5 mg total) by mouth twice daily as needed for pain for up to 30 days 03/28/24     oxyCODONE  (ROXICODONE ) 15 MG immediate release tablet Take 1 tablet (15 mg total) by mouth 3 (three) times daily as needed for breakthrough pain. 12/19/22     oxyCODONE  (ROXICODONE ) 15 MG immediate release tablet Take 1  tablet by mouth 3 times daily as needed 06/03/23     Oxycodone  HCl 10 MG TABS Take 1 tablet (10 mg total) by mouth 3 (three) times daily as needed for severe pain. 03/16/23     Oxycodone  HCl 10 MG TABS Take 1 tablet (10 mg total) by mouth 3 (three) times daily as needed for severe pain. 04/09/23  Oxycodone  HCl 10 MG TABS Take 1 tablet (10 mg total) by mouth 3 (three) times daily as needed for severe pain. 05/12/23     Oxycodone  HCl 10 MG TABS Take 1 tablet (10 mg total) by mouth 4 (four) times daily as needed. 08/07/23     Oxycodone  HCl 10 MG TABS Take 1 tablet (10 mg total) by mouth 2 (two) times daily as needed for pain 06/23/24     Oxycodone  HCl 10 MG TABS Take 1 tablet (10 mg total) by mouth 2 (two) times daily as needed for Pain for up to 30 days. Max Daily Amount: 20 mg 07/23/24 07/23/24     pantoprazole  (PROTONIX ) 40 MG tablet Take 1 tablet by mouth 2 times daily before a meal. Patient not taking: Reported on 06/25/2022 02/10/22 03/27/22  Craig Alan SAUNDERS, PA-C  pregabalin  (LYRICA ) 150 MG capsule Take 1 capsule (150 mg total) by mouth 2 (two) times daily. Patient not taking: Reported on 07/23/2024 02/13/23     pregabalin  (LYRICA ) 75 MG capsule Take 1 capsule by mouth in the morning and 2 capsules at night 10/26/23     pregabalin  (LYRICA ) 75 MG capsule Take one capsule (75 mg dose) by mouth 2 (two) times daily. Max Daily Amount: 150 mg 06/30/24       Family History Family History  Problem Relation Age of Onset   Stroke Father    Colon cancer Neg Hx    Esophageal cancer Neg Hx    Stomach cancer Neg Hx     Social History Social History   Tobacco Use   Smoking status: Never   Smokeless tobacco: Never  Vaping Use   Vaping status: Never Used  Substance Use Topics   Alcohol use: No   Drug use: No     Allergies   Patient has no known allergies.   Review of Systems Review of Systems  Constitutional:  Negative for chills and fever.  HENT:  Negative for ear pain and sore throat.   Eyes:   Negative for pain and visual disturbance.  Respiratory:  Negative for cough and shortness of breath.   Cardiovascular:  Negative for chest pain and palpitations.  Gastrointestinal:  Negative for abdominal pain and vomiting.  Genitourinary:  Negative for dysuria and hematuria.  Musculoskeletal:  Negative for arthralgias and back pain.       Right ankle pain and swelling  Skin:  Negative for color change and rash.  Neurological:  Negative for seizures and syncope.  All other systems reviewed and are negative.    Physical Exam Triage Vital Signs ED Triage Vitals  Encounter Vitals Group     BP 07/23/24 1452 127/84     Girls Systolic BP Percentile --      Girls Diastolic BP Percentile --      Boys Systolic BP Percentile --      Boys Diastolic BP Percentile --      Pulse Rate 07/23/24 1452 67     Resp 07/23/24 1452 16     Temp 07/23/24 1452 98 F (36.7 C)     Temp Source 07/23/24 1452 Oral     SpO2 07/23/24 1452 96 %     Weight --      Height --      Head Circumference --      Peak Flow --      Pain Score 07/23/24 1457 5     Pain Loc --      Pain Education --  Exclude from Growth Chart --    No data found.  Updated Vital Signs BP 127/84 (BP Location: Left Arm)   Pulse 67   Temp 98 F (36.7 C) (Oral)   Resp 16   SpO2 96%   Visual Acuity Right Eye Distance:   Left Eye Distance:   Bilateral Distance:    Right Eye Near:   Left Eye Near:    Bilateral Near:     Physical Exam Vitals and nursing note reviewed.  Constitutional:      General: She is not in acute distress.    Appearance: She is well-developed.  HENT:     Head: Normocephalic and atraumatic.  Eyes:     Conjunctiva/sclera: Conjunctivae normal.  Cardiovascular:     Rate and Rhythm: Normal rate and regular rhythm.     Pulses:          Dorsalis pedis pulses are 2+ on the right side.       Posterior tibial pulses are 2+ on the right side.     Heart sounds: No murmur heard. Pulmonary:     Effort:  Pulmonary effort is normal. No respiratory distress.     Breath sounds: Normal breath sounds.  Abdominal:     Palpations: Abdomen is soft.     Tenderness: There is no abdominal tenderness.  Musculoskeletal:        General: No swelling.     Cervical back: Neck supple.     Right foot: No deformity.       Feet:  Feet:     Right foot:     Skin integrity: Skin integrity normal.     Comments: Brisk capillary refill right toes Skin:    General: Skin is warm and dry.     Capillary Refill: Capillary refill takes less than 2 seconds.  Neurological:     Mental Status: She is alert.  Psychiatric:        Mood and Affect: Mood normal.      UC Treatments / Results  Labs (all labs ordered are listed, but only abnormal results are displayed) Labs Reviewed - No data to display  EKG   Radiology DG Ankle Complete Right Result Date: 07/23/2024 CLINICAL DATA:  Medial right ankle pain x5 days. EXAM: RIGHT ANKLE - COMPLETE 3+ VIEW COMPARISON:  None Available. FINDINGS: A very small cortical deformity of indeterminate age is seen along the dorsal aspect of the right talus. There is no evidence of dislocation. Mild degenerative changes are seen along the dorsal aspect of the proximal right foot. Mild diffuse soft tissue swelling is noted. IMPRESSION: Findings which may represent a small fracture of indeterminate age along the dorsal aspect of the right talus. Correlation with physical examination is recommended to determine the presence of point tenderness. Electronically Signed   By: Suzen Dials M.D.   On: 07/23/2024 16:01    Procedures Procedures (including critical care time)  Medications Ordered in UC Medications - No data to display  Initial Impression / Assessment and Plan / UC Course  I have reviewed the triage vital signs and the nursing notes.  Pertinent labs & imaging results that were available during my care of the patient were reviewed by me and considered in my medical  decision making (see chart for details).     Pain and swelling of right ankle - Plan: DG Ankle Complete Right, DG Ankle Complete Right  Closed nondisplaced fracture of right talus, unspecified portion of talus, initial encounter   X-ray  of the right ankle and foot done today.  Final evaluation by the radiologist shows an indeterminate age fracture along the dorsal aspect of the talus.  This is in the area where you are tender.  Due to these findings we recommend wearing a postoperative shoe while walking or standing and follow-up with orthopedics next week to have them evaluate this.  Recommend Ice the area 2-3 times daily for 10-15 minutes to help with pain and swelling. Do not apply ice directly to the skin.  Also can use ibuprofen  600 mg every 6-8 hours as needed for pain.  Can follow-up with urgent care if needed.  Final Clinical Impressions(s) / UC Diagnoses   Final diagnoses:  Pain and swelling of right ankle  Closed nondisplaced fracture of right talus, unspecified portion of talus, initial encounter     Discharge Instructions      X-ray of the right ankle and foot done today.  Final evaluation by the radiologist shows an indeterminate age fracture along the dorsal aspect of the talus.  This is in the area where you are tender.  Due to these findings we recommend wearing a postoperative shoe while walking or standing and follow-up with orthopedics next week to have them evaluate this.  Recommend Ice the area 2-3 times daily for 10-15 minutes to help with pain and swelling. Do not apply ice directly to the skin.  Also can use ibuprofen  600 mg every 6-8 hours as needed for pain.  Can follow-up with urgent care if needed.     ED Prescriptions   None    PDMP not reviewed this encounter.   Teresa Almarie LABOR, [place] 07/23/24 1616

## 2024-07-28 ENCOUNTER — Other Ambulatory Visit (HOSPITAL_COMMUNITY): Payer: Self-pay

## 2024-08-02 ENCOUNTER — Other Ambulatory Visit: Payer: Self-pay

## 2024-08-02 ENCOUNTER — Ambulatory Visit: Admitting: Physician Assistant

## 2024-08-02 ENCOUNTER — Other Ambulatory Visit (HOSPITAL_COMMUNITY): Payer: Self-pay

## 2024-08-02 ENCOUNTER — Other Ambulatory Visit (HOSPITAL_BASED_OUTPATIENT_CLINIC_OR_DEPARTMENT_OTHER): Payer: Self-pay

## 2024-08-02 DIAGNOSIS — M76821 Posterior tibial tendinitis, right leg: Secondary | ICD-10-CM | POA: Diagnosis not present

## 2024-08-02 DIAGNOSIS — M25571 Pain in right ankle and joints of right foot: Secondary | ICD-10-CM | POA: Diagnosis not present

## 2024-08-02 MED ORDER — PREDNISONE 10 MG PO TABS
10.0000 mg | ORAL_TABLET | Freq: Every day | ORAL | 0 refills | Status: AC
  Filled 2024-08-02: qty 30, 30d supply, fill #0

## 2024-08-02 NOTE — Progress Notes (Unsigned)
 Office Visit Note   Patient: Leah Cain           Date of Birth: 09-Apr-1971           MRN: [id number] Visit Date: 08/02/2024              Requested by: [facility] [address] [place],  [place] [address] PCP: [facility]  No chief complaint on file.     HPI: 53 y/o female with acute right medial ankle/foot pain this started now for 2 weeks.  She was originally seen in urgent care on 07/23/24.  X ray demonstrated a Closed nondisplaced fracture of right talus.  She was placed in a post op shoe.  The findings are reported as A very small cortical deformity of indeterminate age is seen along the dorsal aspect of the right talus.   She states her pain is on the medial ankle down to the arch area and is now worse since she has been walking in her post op shoe.  She denies injury.    Assessment & Plan: Visit Diagnoses: No diagnosis found.  Plan: ***  Follow-Up Instructions: No follow-ups on file.   Ortho Exam  Patient is alert, oriented, no adenopathy, well-dressed, normal affect, normal respiratory effort. PT tendonitis right ankle.  Minimal edema over the medial ankle     Imaging: No results found. No images are attached to the encounter.  Labs: Lab Results  Component Value Date   HGBA1C 5.4 03/03/2016   HGBA1C 5.1 08/31/2013     Lab Results  Component Value Date   ALBUMIN 4.3 02/05/2022   ALBUMIN 4.0 09/25/2018   ALBUMIN 4.3 08/31/2013    No results found for: MG Lab Results  Component Value Date   VD25OH 8 (L) 03/03/2016    No results found for: PREALBUMIN    Latest Ref Rng & Units 02/05/2022   12:17 PM 04/18/2021   12:13 AM 09/25/2018    7:49 PM  CBC EXTENDED  WBC 4.0 - 10.5 K/uL 6.3  9.4  12.6   RBC 3.87 - 5.11 Mil/uL 4.49  4.60  4.73   Hemoglobin 12.0 - 15.0 g/dL 87.1  86.6  87.1   HCT 36.0 - 46.0 % 39.4  41.9  43.2   Platelets 150.0 - 400.0 K/uL 303.0  320  342   NEUT# 1.4 - 7.7 K/uL 3.9     Lymph#  0.7 - 4.0 K/uL 1.7        There is no height or weight on file to calculate BMI.  Orders:  No orders of the defined types were placed in this encounter.  No orders of the defined types were placed in this encounter.    Procedures: No procedures performed  Clinical Data: No additional findings.  ROS:  All other systems negative, except as noted in the HPI. Review of Systems  Objective: Vital Signs: There were no vitals taken for this visit.  Specialty Comments:  No specialty comments available.  PMFS History: Patient Active Problem List   Diagnosis Date Noted  . Cervical disc disorder with radiculopathy of cervical region 11/07/2015  . Chronic low back pain 06/15/2014  . Positive H. pylori test 12/26/2013  . IUD retained (HCC) 09/19/2013  . Postprandial epigastric pain 09/19/2013  . Postprandial RUQ pain 09/19/2013   Past Medical History:  Diagnosis Date  . Arthritis   . Eczema   . Foreign body in maxillary sinus  Metalic object  . GERD (gastroesophageal reflux disease)     Family History  Problem Relation Age of Onset  . Stroke Father   . Colon cancer Neg Hx   . Esophageal cancer Neg Hx   . Stomach cancer Neg Hx     Past Surgical History:  Procedure Laterality Date  . cervical prolapse  2006   Social History   Occupational History  . Not on file  Tobacco Use  . Smoking status: Never  . Smokeless tobacco: Never  Vaping Use  . Vaping status: Never Used  Substance and Sexual Activity  . Alcohol use: No  . Drug use: No  . Sexual activity: Not on file

## 2024-08-03 ENCOUNTER — Encounter: Payer: Self-pay | Admitting: Physician Assistant

## 2024-08-17 ENCOUNTER — Other Ambulatory Visit: Payer: Self-pay

## 2024-08-17 ENCOUNTER — Encounter: Payer: Self-pay | Admitting: Physician Assistant

## 2024-08-17 ENCOUNTER — Ambulatory Visit: Admitting: Physician Assistant

## 2024-08-17 DIAGNOSIS — M76821 Posterior tibial tendinitis, right leg: Secondary | ICD-10-CM

## 2024-08-17 NOTE — Progress Notes (Signed)
 Office Visit Note   Patient: Leah Cain           Date of Birth: [birth year]           MRN: [id number] Visit Date: 08/17/2024              Requested by: [facility] [address] [place],  [place] [address] PCP: [facility]  Chief Complaint  Patient presents with   Right Ankle - Follow-up      HPI: 53 y/o female with acute right medial ankle/foot pain this started now for 2 weeks. She was originally seen in urgent care on 07/23/24.  X ray demonstrated a Closed nondisplaced fracture of right talus.  She was placed in a post op shoe.  The findings are reported as A very small cortical deformity of indeterminate age is seen along the dorsal aspect of the right talus.              She states her pain is on the medial ankle down to the arch area and is now worse since she has been walking in her post op shoe.  She denies injury.    She was placed in a cam walking boot, periodically removing it for ROM of the ankle to prevent stiffness.  Prednisone  10 mg every day with food, ice and elevation.  The pain was constant when she was examined on 08/02/24 and now she only has pain first thing in the morning and at the end of the day.  Her daughter is here with her to help interpret, she does not speak english well.  He daughter has bee accepted into [employer] school starting May 2026.    Assessment & Plan: Visit Diagnoses:  1. Posterior tibial tendinitis of right lower extremity     Plan: Stop prednisone  for now Ibuprofen  PRN is needed.  She was given a longitudinal metatarsal arch pad to wear in the cam boot.  She will try it in a comfortable, stiffer shoe.  I showed then S[employer]k orthotics that can be purchased on line to get.  If she does not continue to improve she will be referred to DR. Brooks for shock wave therapy.    Follow-Up Instructions: Return if symptoms worsen or fail to improve.   Ortho Exam  Patient is alert, oriented, no adenopathy,  well-dressed, normal affect, normal respiratory effort. Minimal tenderness over the posterior tibial tendon from the ankle to the navicular .  No cellulitis or apparent edema.  She has full ROM of the ankle non weight bearing without pain.  I had her stand and she is able to do a single right toe raise without pain.  Invert and plantarflexion against resistance with minimal pain.  She has a palpable DP pulse.      Imaging: A very small cortical deformity of indeterminate age is seen along the dorsal aspect of the right talus.  Plantar heel spur.   No fracture noted, no change from previous x ray.    Labs: Lab Results  Component Value Date   HGBA1C 5.4 03/03/2016   HGBA1C 5.1 08/31/2013     Lab Results  Component Value Date   ALBUMIN 4.3 02/05/2022   ALBUMIN 4.0 09/25/2018   ALBUMIN 4.3 08/31/2013    No results found for: MG Lab Results  Component Value Date   VD25OH 8 (L) 03/03/2016    No results found for: PREALBUMIN    Latest Ref Rng &  Units 02/05/2022   12:17 PM 04/18/2021   12:13 AM 09/25/2018    7:49 PM  CBC EXTENDED  WBC 4.0 - 10.5 K/uL 6.3  9.4  12.6   RBC 3.87 - 5.11 Mil/uL 4.49  4.60  4.73   Hemoglobin 12.0 - 15.0 g/dL 87.1  86.6  87.1   HCT 36.0 - 46.0 % 39.4  41.9  43.2   Platelets 150.0 - 400.0 K/uL 303.0  320  342   NEUT# 1.4 - 7.7 K/uL 3.9     Lymph# 0.7 - 4.0 K/uL 1.7        There is no height or weight on file to calculate BMI.  Orders:  Orders Placed This Encounter  Procedures   XR Ankle Complete Right   No orders of the defined types were placed in this encounter.    Procedures: No procedures performed  Clinical Data: No additional findings.  ROS:  All other systems negative, except as noted in the HPI. Review of Systems  Objective: Vital Signs: There were no vitals taken for this visit.  Specialty Comments:  No specialty comments available.  PMFS History: Patient Active Problem List   Diagnosis Date Noted   Cervical disc  disorder with radiculopathy of cervical region 11/07/2015   Chronic low back pain 06/15/2014   Positive H. pylori test 12/26/2013   IUD retained (HCC) 09/19/2013   Postprandial epigastric pain 09/19/2013   Postprandial RUQ pain 09/19/2013   Past Medical History:  Diagnosis Date   Arthritis    Eczema    Foreign body in maxillary sinus    Metalic object   GERD (gastroesophageal reflux disease)     Family History  Problem Relation Age of Onset   Stroke Father    Colon cancer Neg Hx    Esophageal cancer Neg Hx    Stomach cancer Neg Hx     Past Surgical History:  Procedure Laterality Date   cervical prolapse  2006   Social History   Occupational History   Not on file  Tobacco Use   Smoking status: Never   Smokeless tobacco: Never  Vaping Use   Vaping status: Never Used  Substance and Sexual Activity   Alcohol use: No   Drug use: No   Sexual activity: Not on file

## 2024-08-18 ENCOUNTER — Other Ambulatory Visit (HOSPITAL_COMMUNITY): Payer: Self-pay

## 2024-08-18 MED ORDER — OXYCODONE HCL 10 MG PO TABS
10.0000 mg | ORAL_TABLET | Freq: Two times a day (BID) | ORAL | 0 refills | Status: DC | PRN
  Filled 2024-08-22: qty 60, 30d supply, fill #0

## 2024-08-18 MED ORDER — PREGABALIN 150 MG PO CAPS
150.0000 mg | ORAL_CAPSULE | Freq: Two times a day (BID) | ORAL | 2 refills | Status: DC
  Filled 2024-08-18 (×2): qty 60, 30d supply, fill #0
  Filled 2024-09-16: qty 60, 30d supply, fill #1
  Filled 2024-10-14: qty 60, 30d supply, fill #2

## 2024-08-20 ENCOUNTER — Other Ambulatory Visit (HOSPITAL_COMMUNITY): Payer: Self-pay

## 2024-08-22 ENCOUNTER — Other Ambulatory Visit: Payer: Self-pay

## 2024-08-22 ENCOUNTER — Encounter: Payer: Self-pay | Admitting: Radiology

## 2024-08-22 ENCOUNTER — Other Ambulatory Visit (HOSPITAL_COMMUNITY): Payer: Self-pay

## 2024-09-16 ENCOUNTER — Other Ambulatory Visit (HOSPITAL_COMMUNITY): Payer: Self-pay

## 2024-09-19 ENCOUNTER — Other Ambulatory Visit (HOSPITAL_COMMUNITY): Payer: Self-pay

## 2024-09-19 MED ORDER — OXYCODONE HCL 10 MG PO TABS
10.0000 mg | ORAL_TABLET | Freq: Two times a day (BID) | ORAL | 0 refills | Status: AC | PRN
  Filled 2024-09-19: qty 60, 30d supply, fill #0

## 2024-09-20 ENCOUNTER — Other Ambulatory Visit (HOSPITAL_COMMUNITY): Payer: Self-pay

## 2024-10-14 ENCOUNTER — Other Ambulatory Visit (HOSPITAL_COMMUNITY): Payer: Self-pay

## 2024-10-17 ENCOUNTER — Other Ambulatory Visit (HOSPITAL_COMMUNITY): Payer: Self-pay

## 2024-10-17 MED ORDER — OXYCODONE HCL 10 MG PO TABS
10.0000 mg | ORAL_TABLET | Freq: Two times a day (BID) | ORAL | 0 refills | Status: AC
  Filled 2024-10-17: qty 60, 30d supply, fill #0

## 2024-11-10 ENCOUNTER — Other Ambulatory Visit (HOSPITAL_COMMUNITY): Payer: Self-pay

## 2024-11-10 MED ORDER — OXYCODONE HCL 10 MG PO TABS
10.0000 mg | ORAL_TABLET | Freq: Two times a day (BID) | ORAL | 0 refills | Status: AC
  Filled 2024-11-18: qty 60, 30d supply, fill #0

## 2024-11-10 MED ORDER — PREGABALIN 150 MG PO CAPS
150.0000 mg | ORAL_CAPSULE | Freq: Two times a day (BID) | ORAL | 2 refills | Status: AC
  Filled 2024-11-10: qty 60, 30d supply, fill #0

## 2024-11-16 ENCOUNTER — Other Ambulatory Visit (HOSPITAL_COMMUNITY): Payer: Self-pay

## 2024-11-18 ENCOUNTER — Other Ambulatory Visit (HOSPITAL_COMMUNITY): Payer: Self-pay
